# Patient Record
Sex: Female | Born: 1980 | Race: Black or African American | Hispanic: No | Marital: Married | Smoking: Never smoker
Health system: Southern US, Community
[De-identification: ages and names within clinical notes are randomized; demographics above are authoritative.]

## PROBLEM LIST (undated history)

## (undated) DIAGNOSIS — E669 Obesity, unspecified: Secondary | ICD-10-CM

## (undated) DIAGNOSIS — F32A Depression, unspecified: Secondary | ICD-10-CM

## (undated) DIAGNOSIS — F329 Major depressive disorder, single episode, unspecified: Secondary | ICD-10-CM

## (undated) HISTORY — DX: Depression, unspecified: F32.A

## (undated) HISTORY — DX: Obesity, unspecified: E66.9

## (undated) HISTORY — DX: Major depressive disorder, single episode, unspecified: F32.9

---

## 2002-09-20 ENCOUNTER — Encounter: Payer: Self-pay | Admitting: Obstetrics and Gynecology

## 2002-09-20 ENCOUNTER — Inpatient Hospital Stay (HOSPITAL_COMMUNITY): Admission: AD | Admit: 2002-09-20 | Discharge: 2002-09-22 | Payer: Self-pay | Admitting: Obstetrics and Gynecology

## 2002-11-26 ENCOUNTER — Emergency Department (HOSPITAL_COMMUNITY): Admission: EM | Admit: 2002-11-26 | Discharge: 2002-11-26 | Payer: Self-pay | Admitting: Emergency Medicine

## 2002-12-28 ENCOUNTER — Ambulatory Visit (HOSPITAL_COMMUNITY): Admission: RE | Admit: 2002-12-28 | Discharge: 2002-12-28 | Payer: Self-pay | Admitting: *Deleted

## 2003-03-15 ENCOUNTER — Ambulatory Visit (HOSPITAL_COMMUNITY): Admission: RE | Admit: 2003-03-15 | Discharge: 2003-03-15 | Payer: Self-pay | Admitting: Obstetrics and Gynecology

## 2003-04-21 ENCOUNTER — Inpatient Hospital Stay (HOSPITAL_COMMUNITY): Admission: AD | Admit: 2003-04-21 | Discharge: 2003-04-21 | Payer: Self-pay | Admitting: Obstetrics and Gynecology

## 2003-04-27 ENCOUNTER — Observation Stay (HOSPITAL_COMMUNITY): Admission: AD | Admit: 2003-04-27 | Discharge: 2003-04-28 | Payer: Self-pay | Admitting: Obstetrics and Gynecology

## 2003-05-02 ENCOUNTER — Encounter: Admission: RE | Admit: 2003-05-02 | Discharge: 2003-05-02 | Payer: Self-pay | Admitting: *Deleted

## 2003-05-06 ENCOUNTER — Inpatient Hospital Stay (HOSPITAL_COMMUNITY): Admission: AD | Admit: 2003-05-06 | Discharge: 2003-05-06 | Payer: Self-pay | Admitting: Obstetrics and Gynecology

## 2003-05-10 ENCOUNTER — Inpatient Hospital Stay (HOSPITAL_COMMUNITY): Admission: AD | Admit: 2003-05-10 | Discharge: 2003-05-12 | Payer: Self-pay | Admitting: *Deleted

## 2004-08-20 ENCOUNTER — Other Ambulatory Visit: Admission: RE | Admit: 2004-08-20 | Discharge: 2004-08-20 | Payer: Self-pay | Admitting: Obstetrics and Gynecology

## 2005-11-10 ENCOUNTER — Inpatient Hospital Stay (HOSPITAL_COMMUNITY): Admission: AD | Admit: 2005-11-10 | Discharge: 2005-11-11 | Payer: Self-pay | Admitting: Obstetrics & Gynecology

## 2005-12-08 ENCOUNTER — Other Ambulatory Visit: Admission: RE | Admit: 2005-12-08 | Discharge: 2005-12-08 | Payer: Self-pay | Admitting: Obstetrics and Gynecology

## 2006-01-04 ENCOUNTER — Other Ambulatory Visit: Admission: RE | Admit: 2006-01-04 | Discharge: 2006-01-04 | Payer: Self-pay | Admitting: Obstetrics and Gynecology

## 2006-01-31 ENCOUNTER — Ambulatory Visit (HOSPITAL_COMMUNITY): Admission: RE | Admit: 2006-01-31 | Discharge: 2006-01-31 | Payer: Self-pay | Admitting: Obstetrics and Gynecology

## 2006-05-17 ENCOUNTER — Ambulatory Visit (HOSPITAL_COMMUNITY): Admission: RE | Admit: 2006-05-17 | Discharge: 2006-05-17 | Payer: Self-pay | Admitting: Obstetrics and Gynecology

## 2006-05-27 ENCOUNTER — Inpatient Hospital Stay (HOSPITAL_COMMUNITY): Admission: AD | Admit: 2006-05-27 | Discharge: 2006-05-27 | Payer: Self-pay | Admitting: Obstetrics and Gynecology

## 2006-06-04 ENCOUNTER — Inpatient Hospital Stay (HOSPITAL_COMMUNITY): Admission: AD | Admit: 2006-06-04 | Discharge: 2006-06-04 | Payer: Self-pay | Admitting: Obstetrics and Gynecology

## 2006-06-09 ENCOUNTER — Emergency Department (HOSPITAL_COMMUNITY): Admission: EM | Admit: 2006-06-09 | Discharge: 2006-06-09 | Payer: Self-pay | Admitting: Family Medicine

## 2006-06-18 ENCOUNTER — Inpatient Hospital Stay (HOSPITAL_COMMUNITY): Admission: AD | Admit: 2006-06-18 | Discharge: 2006-06-18 | Payer: Self-pay | Admitting: Obstetrics and Gynecology

## 2006-06-20 ENCOUNTER — Inpatient Hospital Stay (HOSPITAL_COMMUNITY): Admission: RE | Admit: 2006-06-20 | Discharge: 2006-06-22 | Payer: Self-pay | Admitting: Obstetrics and Gynecology

## 2006-07-19 ENCOUNTER — Inpatient Hospital Stay (HOSPITAL_COMMUNITY): Admission: AD | Admit: 2006-07-19 | Discharge: 2006-07-19 | Payer: Self-pay | Admitting: Obstetrics and Gynecology

## 2006-12-27 ENCOUNTER — Emergency Department (HOSPITAL_COMMUNITY): Admission: EM | Admit: 2006-12-27 | Discharge: 2006-12-27 | Payer: Self-pay | Admitting: Emergency Medicine

## 2007-12-20 ENCOUNTER — Emergency Department (HOSPITAL_COMMUNITY): Admission: EM | Admit: 2007-12-20 | Discharge: 2007-12-20 | Payer: Self-pay | Admitting: Family Medicine

## 2007-12-26 IMAGING — CR DG CHEST 2V
2 series · 2 of 2 positions shown · non-contrast
Comparison: None.

CLINICAL DATA: Right-sided chest pain.

CHEST - 2 VIEW  12/27/2006:

[view not recorded (1 of 2)]
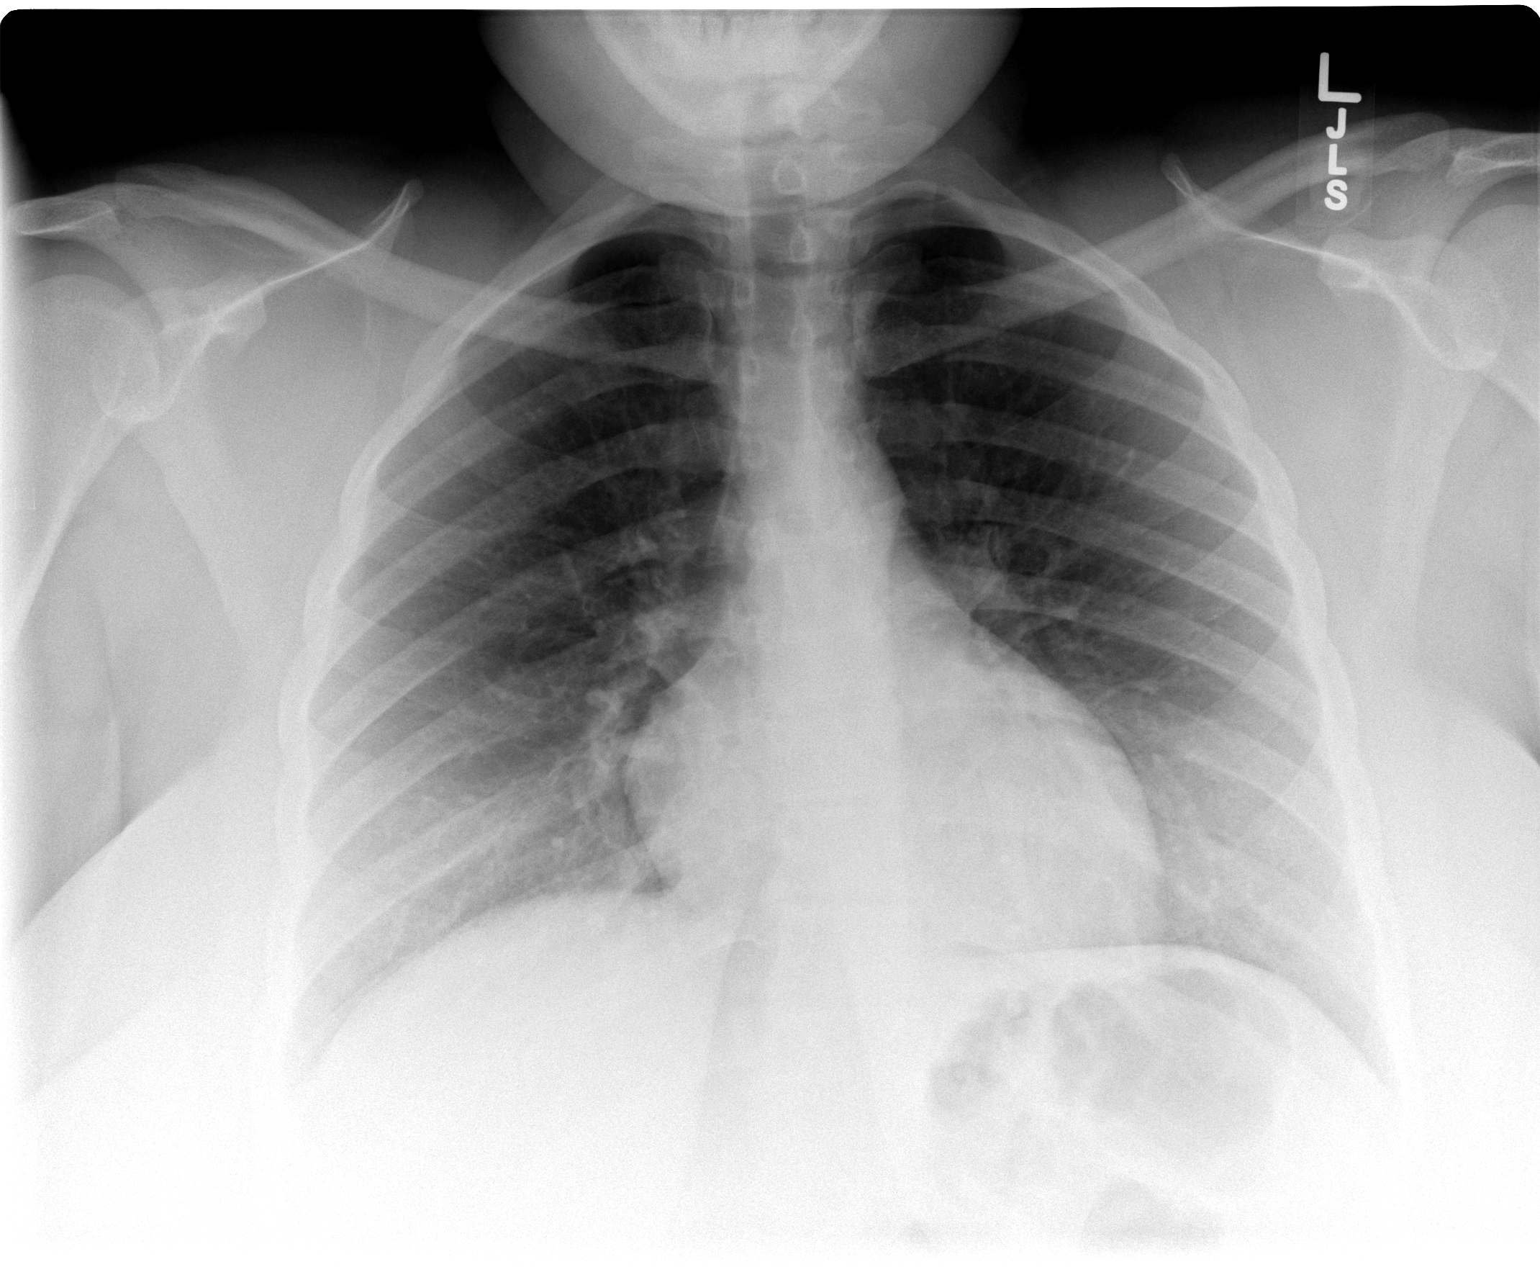

[view not recorded (2 of 2)]
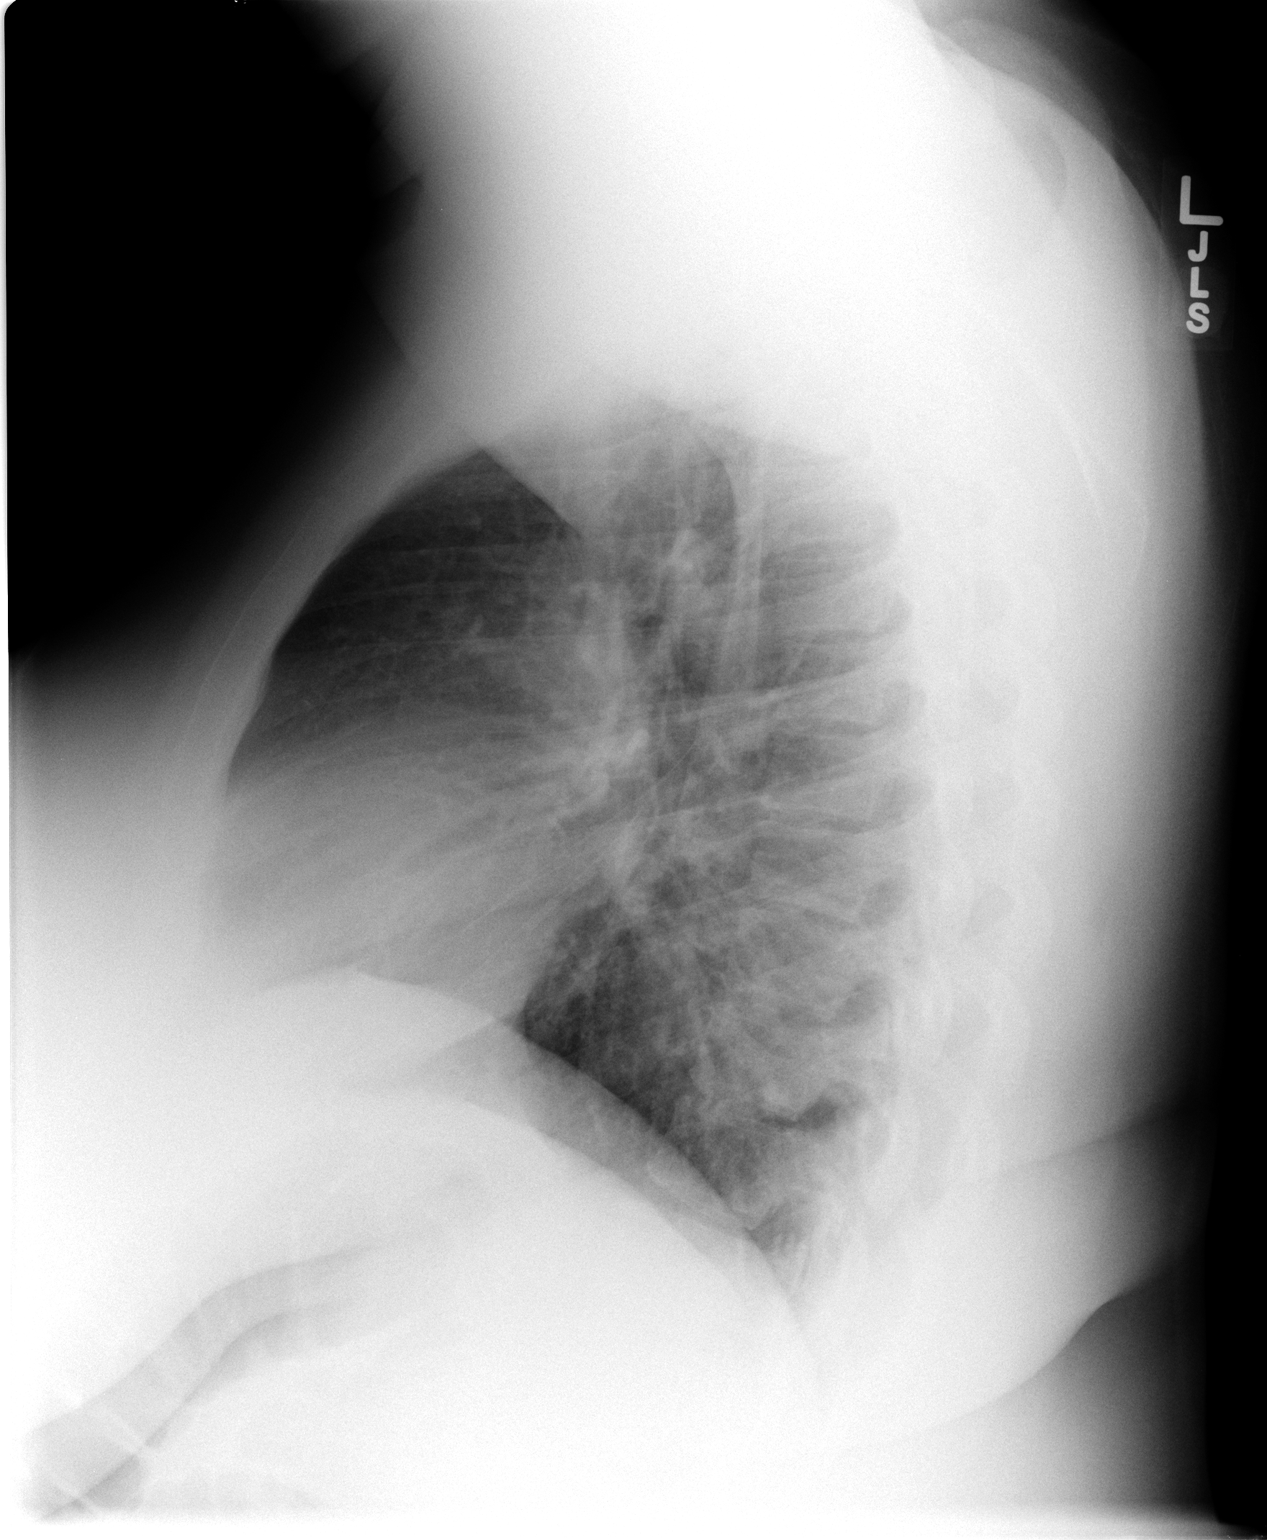

[2 of 2 positions shown; findings below may reference images not displayed]

FINDINGS: Cardiomediastinal silhouette unremarkable for age. Pulmonary
parenchyma clear. Visualized bony thorax intact.
IMPRESSION: Normal chest.

## 2010-06-20 ENCOUNTER — Inpatient Hospital Stay (INDEPENDENT_AMBULATORY_CARE_PROVIDER_SITE_OTHER)
Admission: RE | Admit: 2010-06-20 | Discharge: 2010-06-20 | Disposition: A | Discharge status: Home / Self Care | Payer: Self-pay | Source: Ambulatory Visit | Attending: Family Medicine | Admitting: Family Medicine

## 2010-06-20 DIAGNOSIS — H109 Unspecified conjunctivitis: Secondary | ICD-10-CM

## 2010-06-20 DIAGNOSIS — J309 Allergic rhinitis, unspecified: Secondary | ICD-10-CM

## 2010-09-18 NOTE — Discharge Summary (Signed)
[name], Sarah Roach                           ACCOUNT NO.:  [id number]   MEDICAL RECORD NO.:  [id number]                   PATIENT TYPE:   LOCATION:                                       FACILITY:   PHYSICIAN:  Phil D. Okey Dupre, M.D.                  DATE OF BIRTH:  Jul 29, 1980   DATE OF ADMISSION:  DATE OF DISCHARGE:                                 DISCHARGE SUMMARY   DISCHARGE DIAGNOSES:  1. Intrauterine pregnancy at 37 4/7 weeks' gestation.  2. Prodromal labor.   DISCHARGE MEDICATIONS:  1. Iron sulfate.  2. Prenatal vitamins.   HOSPITAL COURSE:  A 30 year old G2, P1-0-0-1, who presented at 33 3/7 weeks'  gestation on April 27, 2003, for contractions that started early in the  morning.  She denied any rupture of membrane or vaginal bleeding.  She had  good fetal movement and contractions were getting stronger.  She had a  history of precipitous delivery with her first child.  Initial set of vitals  was within normal limits with a blood pressure of 110/69.  Her exam showed a  cervix of 3 to 4 cm, 60% effacement, and -3 station with a posterior cervix,  baby vertex.  The patient was walked for two hours in MAU and progressed to  a cervix of 4 cm, 80%, and -3 station.  Fetal heart rate was reassuring with  135 to 140 baseline, accelerations, good variability, and mild variable  decelerations.  The patient was given Stadol and Phenergan for her early  labor and placed on the monitor.  She was admitted for observation.  On  hospital day number two, the patient was rechecked and cervix remained about  5 cm, 50% effaced, and very posterior and high.  Fetal heart tones remained  reassuring and contraction pattern has stopped.  The patient was still  without rupture membranes; therefore a BPP and AFI were checked.   RADIOLOGY RESULTS:  BPP showed a score of 8/8 with a fetal heart rate of  128, a posterior grade 2 placenta, cephalic presentation, amniotic fluid  index was 9.7 cm.   PERTINENT LABORATORY:  On admission, the patient was noted to be GBS  positive.   Due to the patient unable to change her cervix within the last eight hours,  no contraction pattern at 37 4/7 weeks' gestation, intact membranes, and  reassuring fetal heart tones, reassuring BPP and amniotic fluid index, we  will send the patient home with labor instructions.  The patient has a  followup appointment at [facility] on Thursday at 1230 and is to call for  an appointment with Diane Day for a follow up ultrasound this week.     Lorne Skeens, D.O.                         Phil D. Okey Dupre, M.D.    Erick Alley  D:  04/28/2003  T:  04/28/2003  Job:  [id number]

## 2010-09-18 NOTE — Discharge Summary (Signed)
   Sarah Roach, Sarah Roach                           ACCOUNT NO.:  [id number]   MEDICAL RECORD NO.:  [id number]                   PATIENT TYPE:  INP   LOCATION:  9303                                 [facility]ACILITY:  [facility]   PHYSICIAN:  Tamika J. Lazarus Salines, M.D.                DATE OF BIRTH:  1980-10-20   DATE OF ADMISSION:  09/19/2002  DATE OF DISCHARGE:  09/22/2002                                 DISCHARGE SUMMARY   DISCHARGE DIAGNOSES:  1. Intrauterine pregnancy at six weeks.  2. Fever.  3. UTI.  4. Viral syndrome.   DISCHARGE MEDICATIONS:  1. Prenatal vitamin p.o. daily.  2. Amoxicillin 500 mg p.o. t.i.d. x 7 days.   FOLLOW UP:  Patient instructed to call [facility] on Monday to schedule  followup appointment and initial visit for prenatal care.   HISTORY OF PRESENT ILLNESS:  In brief, the patient is a 30 year old African  American female gravida 2, para 1-0-0-1 who presented to [facility]  with fever to 103.2, chills, headache, nausea and vomited for several days.  She denied any abdominal pain, vaginal bleeding, vaginal discharge or  dysuria.  Did report malodorous urine.   ADMISSION LABORATORY DATA:  Labs on admission included WBC 13.6, hemoglobin  11.6, platelets 219.  Sodium 129, potassium 3.3, chloride 98, CO2 20,  glucose 101, BUN 10, creatinine 1.2.   Cath urinalysis revealed 15 ketones, 100 protein, positive nitrite and small  leukocyte esterase.   HOSPITAL COURSE:  The patient was admitted for presumed urinary tract  infection.  The hospital course was uncomplicated.  She was hydrated with IV  fluids.  She was started on treatment with IV Ampicillin.  Urine culture  grew 1,000 colonies of E. coli.  She gradually defervesced.  By day of  discharge her temperature was 98.9.  She was feeling much better and wanting  to go home.   DIAGNOSTIC STUDIES:  Of note she did have an OB ultrasound during this  hospitalization which confirmed her pregnancy at six weeks on Sep 20, 2002.   PLAN:  Continue amoxicillin for seven days to complete a ten day course of  antibiotics for UTI.   DISCHARGE INSTRUCTIONS:  Patient was discharged to home in stable condition.                                                Tamika J. Lazarus Salines, M.D.    [name]  D:  09/22/2002  T:  09/22/2002  Job:  [id number]

## 2010-09-18 NOTE — Discharge Summary (Signed)
Sarah Roach, Sarah Roach               ACCOUNT NO.:  [id number]   MEDICAL RECORD NO.:  [id number]          PATIENT TYPE:  INP   LOCATION:  9128                          [facility]ACILITY:  [facility]   PHYSICIAN:  James A. Ashley Royalty, M.D.DATE OF BIRTH:  1980/10/21   DATE OF ADMISSION:  06/20/2006  DATE OF DISCHARGE:  06/22/2006                               DISCHARGE SUMMARY   DISCHARGE DIAGNOSES:  1. Intrauterine pregnancy at 39+ weeks' gestation, delivered.  2. Obesity.  3. 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  4. Term-birth living child, vertex.   OPERATIONS AND SPECIAL PROCEDURES:  OB delivery.   CONSULTATIONS:  None.   DISCHARGE MEDICATIONS:  Motrin 600 mg.   HISTORY AND PHYSICAL:  A 30 year old gravida 3, para 2-0-1-2, admitted  for induction secondary to musculoskeletal discomfort. For the remainder  of the history and physical, please see chart.   HOSPITAL COURSE:  The patient was admitted to [facility]  [place].   Laboratory studies were drawn. Rupture of membranes was accomplished and  Pitocin given. The patient went on to deliver on June 20, 2006 a 7-  pound 4-ounce female, Apgars 9 at one minute, 9 at five minutes, sent to  newborn nursery. Delivery was accomplished by Dr. Sylvester Harder without  complication. The patient's postpartum course was similarly benign. She  was discharged on the second postpartum day, afebrile and in  satisfactory condition.   DISPOSITION:  The patient is to return to [facility] in 6 weeks for postpartum evaluation.      James A. Ashley Royalty, M.D.  Electronically Signed     JAM/MEDQ  D:  08/03/2006  T:  08/03/2006  Job:  [id number]

## 2010-11-18 ENCOUNTER — Other Ambulatory Visit (HOSPITAL_COMMUNITY)
Admission: RE | Admit: 2010-11-18 | Discharge: 2010-11-18 | Disposition: A | Discharge status: Home / Self Care | Payer: 59 | Source: Ambulatory Visit | Attending: Family Medicine | Admitting: Family Medicine

## 2010-11-18 ENCOUNTER — Other Ambulatory Visit: Payer: Self-pay | Admitting: Family Medicine

## 2010-11-18 DIAGNOSIS — Z01419 Encounter for gynecological examination (general) (routine) without abnormal findings: Secondary | ICD-10-CM | POA: Insufficient documentation

## 2010-11-18 DIAGNOSIS — Z113 Encounter for screening for infections with a predominantly sexual mode of transmission: Secondary | ICD-10-CM | POA: Insufficient documentation

## 2012-05-26 ENCOUNTER — Ambulatory Visit (HOSPITAL_COMMUNITY): Payer: 59 | Admitting: Psychiatry

## 2012-06-13 ENCOUNTER — Encounter (HOSPITAL_COMMUNITY): Payer: Self-pay | Admitting: Psychiatry

## 2012-06-13 ENCOUNTER — Ambulatory Visit (INDEPENDENT_AMBULATORY_CARE_PROVIDER_SITE_OTHER): Payer: 59 | Admitting: Psychiatry

## 2012-06-13 VITALS — BP 144/84 | HR 69 | Wt 317.6 lb

## 2012-06-13 DIAGNOSIS — F329 Major depressive disorder, single episode, unspecified: Secondary | ICD-10-CM

## 2012-06-13 MED ORDER — FLUOXETINE HCL 10 MG PO CAPS: ORAL_CAPSULE | ORAL | Status: DC

## 2012-06-13 NOTE — Progress Notes (Signed)
Patient ID: Sarah Roach, female   DOB: 31-May-1980, 32 y.o.   MRN: [id number]  Chief complaint I'm suffering from depression.  I need help.  History presenting illness Patient is 32 year old African American single unemployed female who is self-referred for seeking treatment for her depression.  Patient endorse for past 2 years she's been experiencing depressive symptoms.  She endorse irritability, sadness, discouragement, low self esteem, crying spells and decreased motivation in her life.  She also endorse decreased attention and concentration and eating more cope with her depression.  She has gained 40 pounds in past few months.  She admitted her mood swings are getting worse and recently she started to have passive suicidal thinking but no plan.  She sleeps on and off in endorse racing thoughts.  Her main stressors are finances, raising 3 kids and stressful job.  Patient is overweight and last year she was enrolled in weight loss clinic, she was seen in bariatric clinic and given phentermine and vitamin B12 shots.  She was able to loss weight however due to finances she was unable to keep up with her appointment in gain more weight.  Patient endorse paranoia in public places and does not feel comfortable around strangers.  She's not taking any psychotropic medication.  She's not engage in any counseling or therapy.  Past psychiatric history Patient has a previous history of psychiatric inpatient treatment or any suicidal attempt.  She denies any history of seeing psychiatrist or any therapy.  She endorse the symptoms for a long time but never treated or seen any help.  She denies any history of mania.  Psychosocial history Patient was born and raised in [place].  Her parents live in [place].  She has 30 children from 2 different relationship.  Her brother and sister who lives in town.  She has limited social support.  Education and work history Patient has some college education.  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  She admitted her job has been a stressful in recent months.  Alcohol and substance use history She admitted drinking alcohol on occasion.  She denies any binge drinking or any history of withdrawal tremors shakes.  Patient denies any history of using marijuana or any illegal substance use.  Medical history She has obesity.  She see physician at [facility] physician at this market.  Her annual physical and working blood test is scheduled in few weeks.  Review of Systems  Constitutional: Negative.   Musculoskeletal: Negative.   Neurological: Negative.   Psychiatric/Behavioral: Positive for depression and hallucinations. Negative for substance abuse. The patient is nervous/anxious and has insomnia.    Mental status examination Patient is casually dressed and fairly groomed.  She dressed appropriate for weather.  She maintained good eye contact.  Her speech is slow but clear and coherent with normal tone volume.  Her thought processes slow but logical linear and goal-directed.  Her thoughts are organized.  She described her mood is depressed and anxious and her affect is constricted.  There were no flight of ideas or any loose association.  Her fund of knowledge is adequate.  She endorse passive suicidal thoughts but no active suicidal thinking or plan.  She denies any auditory or visual hallucination at this time.  She endorse paranoia around strangers but there were no delusion obsession present at this time.  Her psychomotor activity is slightly decreased.  However there were no tremors or shakes present.  There were  no homicidal thoughts.  Her attention and concentration is fair.  Her memory is intact.  She's oriented x3.  Her insight judgment and impulse control is okay.  Assessment Axis I depressive disorder NOS, rule out Maj. depressive disorder with psychotic features Axis II deferred Axis III obesity Axis IV mild to moderate Axis V 55-65  Plan I  review her symptoms, psychosocial stressors discuss with her to start antidepressant.  Talked about starting Prozac 10 mg daily and gradually increase to 20 mg after one week.  I explained risks and benefits of medication in detail including short-term and long-term side effects.  I also referred to see therapist for coping and social skills.  We will get collateral information from her primary care physician including blood work and collateral records.  I recommend to call us if she is any question or concern if he feel worsening of the symptom.  We discussed safety plan that anytime having active suicidal thoughts or homicidal thoughts and he to call 911 or go to local emergency room.  Time spent 60 minutes.  I will see her again in 2-3 weeks.  Portion of this note is generated with software dictation and may contain typographical error.

## 2012-06-27 ENCOUNTER — Ambulatory Visit (HOSPITAL_COMMUNITY): Payer: Self-pay | Admitting: Psychiatry

## 2012-07-05 ENCOUNTER — Encounter (HOSPITAL_COMMUNITY): Payer: Self-pay

## 2012-07-05 ENCOUNTER — Ambulatory Visit (HOSPITAL_COMMUNITY): Payer: Self-pay | Admitting: Psychiatry

## 2012-11-22 ENCOUNTER — Other Ambulatory Visit: Payer: Self-pay | Admitting: Obstetrics and Gynecology

## 2012-11-22 DIAGNOSIS — E010 Iodine-deficiency related diffuse (endemic) goiter: Secondary | ICD-10-CM

## 2012-11-24 ENCOUNTER — Ambulatory Visit
Admission: RE | Admit: 2012-11-24 | Discharge: 2012-11-24 | Disposition: A | Discharge status: Home / Self Care | Payer: 59 | Source: Ambulatory Visit | Attending: Obstetrics and Gynecology | Admitting: Obstetrics and Gynecology

## 2012-11-24 DIAGNOSIS — E010 Iodine-deficiency related diffuse (endemic) goiter: Secondary | ICD-10-CM

## 2012-12-13 ENCOUNTER — Other Ambulatory Visit: Payer: Self-pay | Admitting: Otolaryngology

## 2013-11-23 IMAGING — US US SOFT TISSUE HEAD/NECK
1 series · 14 of 25 positions shown · non-contrast
Comparison: None.

CLINICAL DATA: Thyromegaly

THYROID ULTRASOUND
TECHNIQUE: Ultrasound examination of the thyroid gland and adjacent
soft tissues was performed.

[Series 1: us soft tissue head/neck · 0.08mm/px · 14 of 52 slices shown]
[im 1/52]
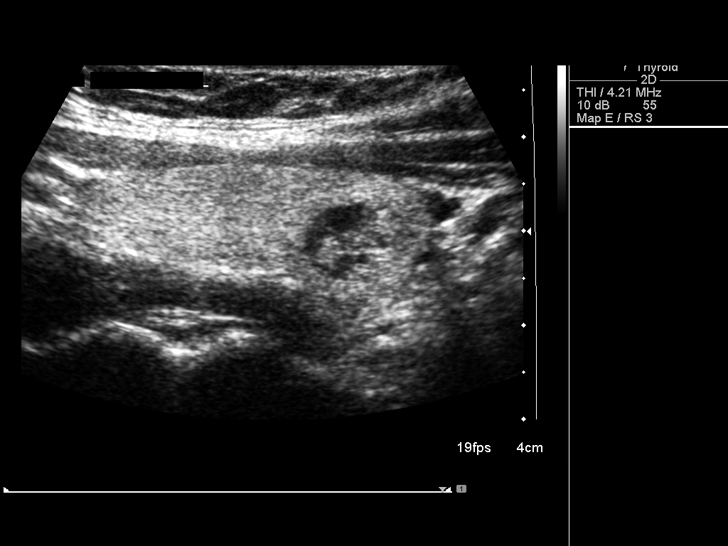
[im 5/52]
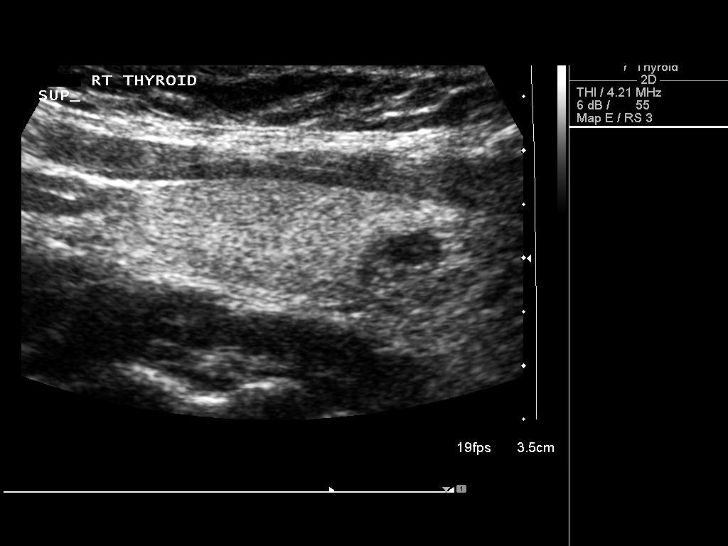
[im 9/52]
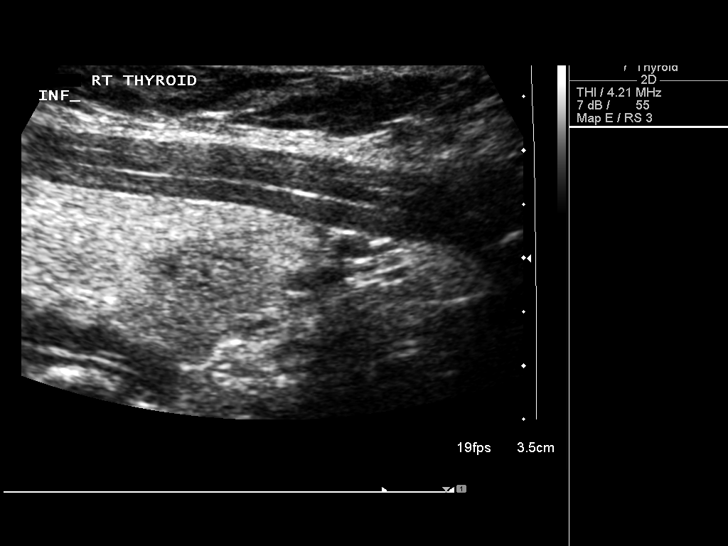
[im 13/52]
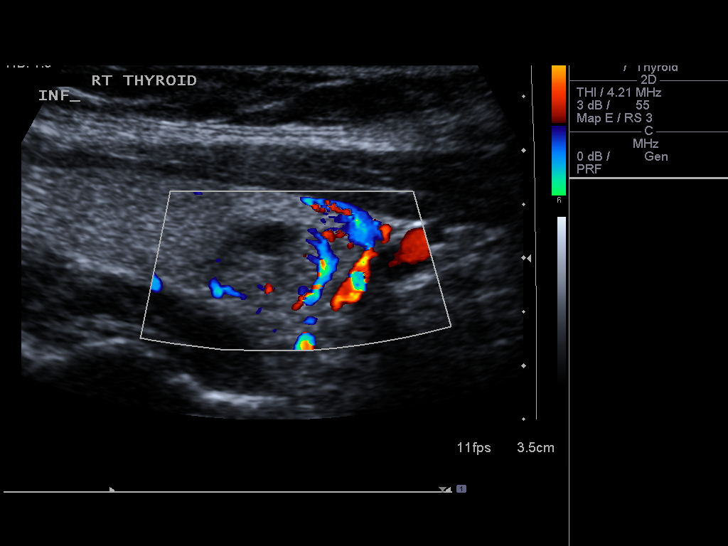
[im 18/52]
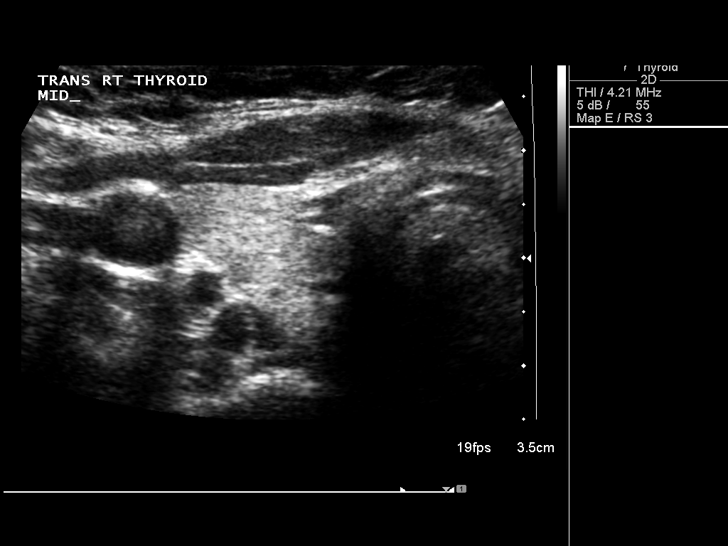
[im 20/52]
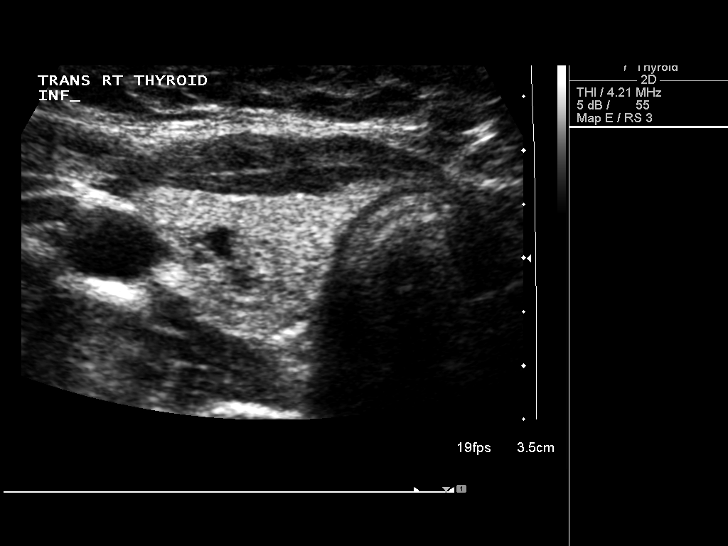
[im 24/52]
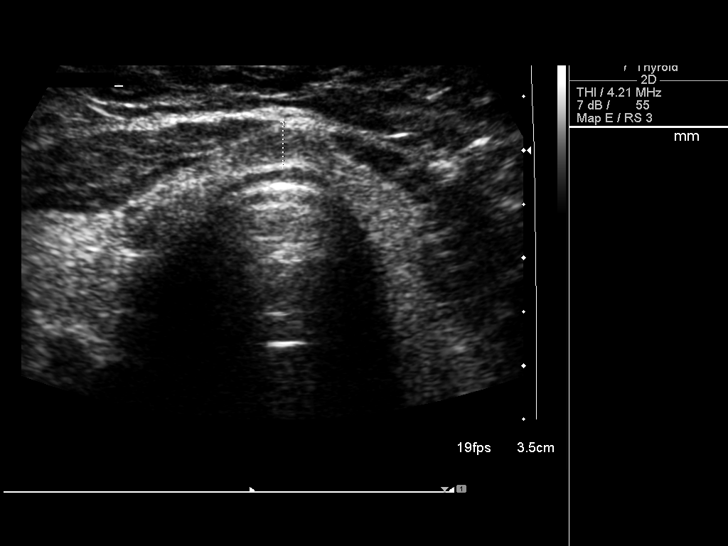
[im 28/52]
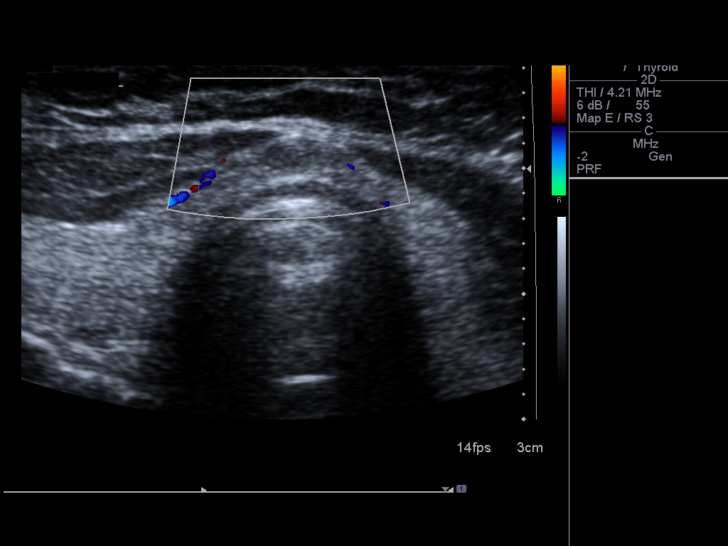
[im 32/52]
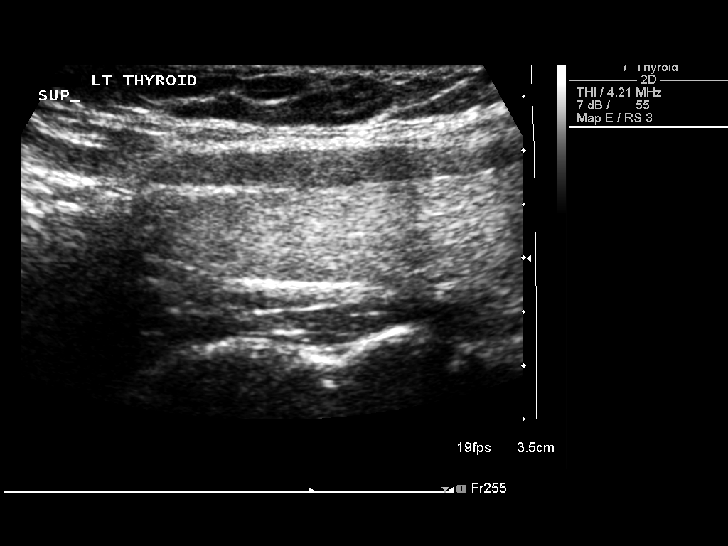
[im 35/52]
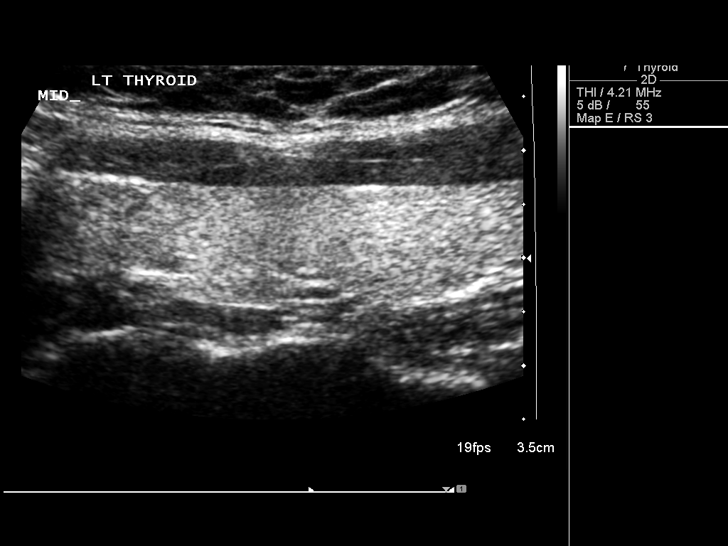
[im 39/52]
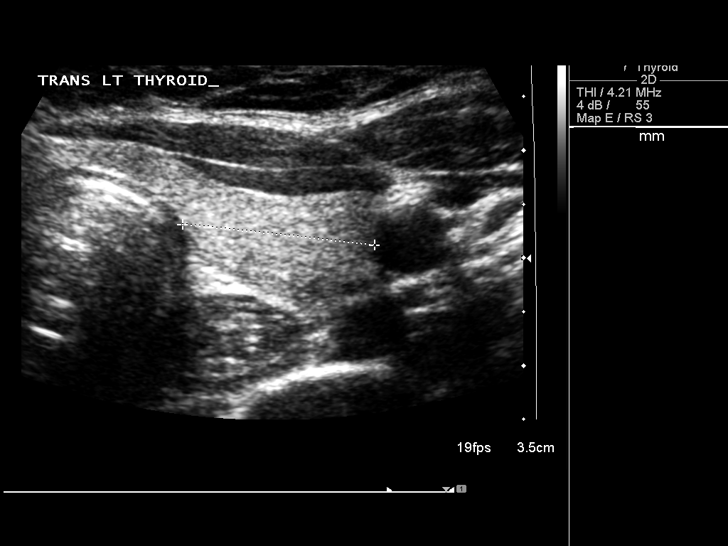
[im 43/52]
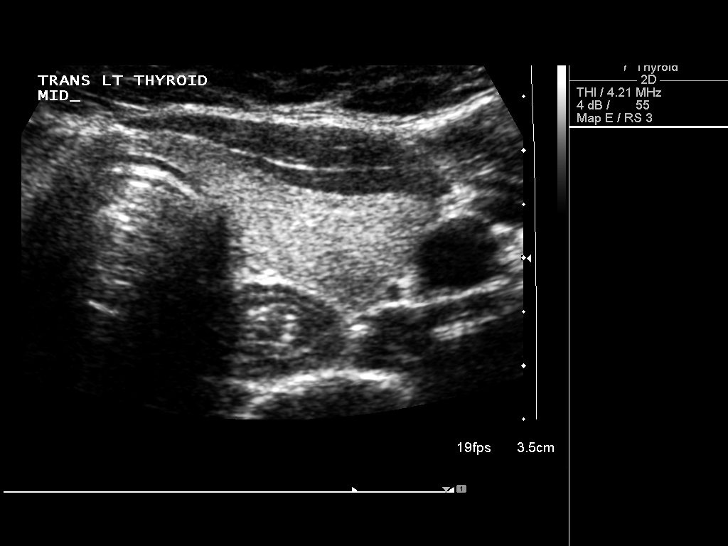
[im 47/52]
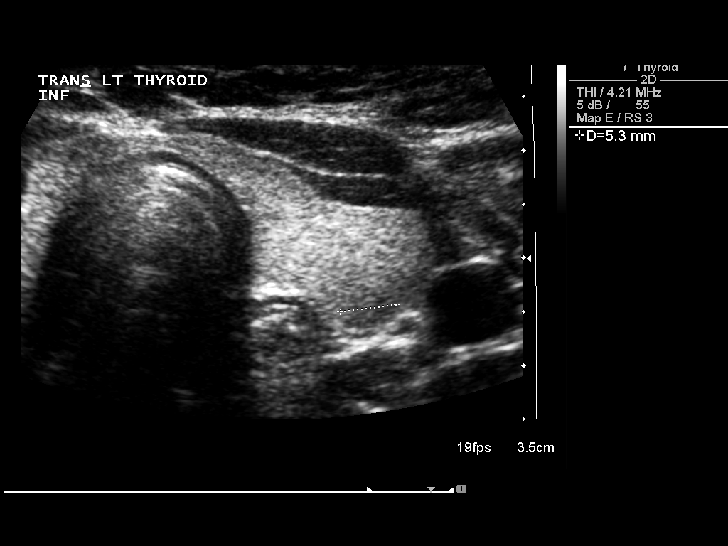
[im 52/52]
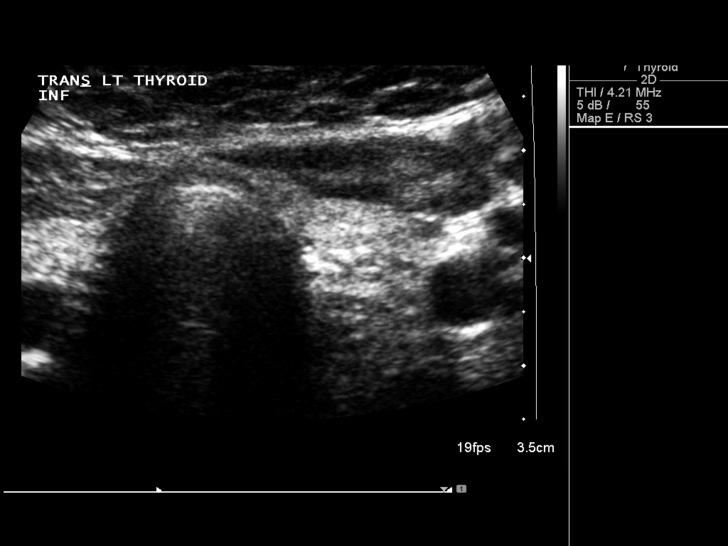

[14 of 25 positions shown; findings below may reference images not displayed]

FINDINGS: Right thyroid lobe:  3.9 x 1.7 x 1.3 cm
Left thyroid lobe:  4.7 x 1.8 x 1.2 cm
Isthmus:  0.4 cm

Focal nodules:

Right inferior thyroid lobe, solid/cystic, 1.3 x 1.0 x 0.8 cm.

Isthmus:  0.5 x 0.4 cm, solid

Left mid thyroid lobe, 1.0 x 0.5 x 0.5 cm, solid

Lymphadenopathy:  None visualized.
IMPRESSION: Thyroid nodules as above. Findings do not meet current SRU
consensus criteria for biopsy.  Follow-up by clinical exam is
recommended.  If patient has known risk factors for thyroid
carcinoma, consider follow-up ultrasound in 12 months.  If patient
is clinically hyperthyroid, consider nuclear medicine thyroid
uptake and scan.

Reference:  Management of Thyroid Nodules Detected at US:  Society
of Radiologists in Ultrasound Consensus Conference Statement.

## 2014-11-21 ENCOUNTER — Emergency Department (HOSPITAL_COMMUNITY)
Admission: EM | Admit: 2014-11-21 | Discharge: 2014-11-21 | Disposition: A | Discharge status: Home / Self Care | Payer: BLUE CROSS/BLUE SHIELD | Attending: Emergency Medicine | Admitting: Emergency Medicine

## 2014-11-21 ENCOUNTER — Encounter (HOSPITAL_COMMUNITY): Payer: Self-pay | Admitting: Emergency Medicine

## 2014-11-21 ENCOUNTER — Emergency Department (HOSPITAL_COMMUNITY): Payer: BLUE CROSS/BLUE SHIELD

## 2014-11-21 DIAGNOSIS — R0789 Other chest pain: Secondary | ICD-10-CM

## 2014-11-21 DIAGNOSIS — F329 Major depressive disorder, single episode, unspecified: Secondary | ICD-10-CM | POA: Diagnosis not present

## 2014-11-21 DIAGNOSIS — E669 Obesity, unspecified: Secondary | ICD-10-CM | POA: Diagnosis not present

## 2014-11-21 DIAGNOSIS — Z79899 Other long term (current) drug therapy: Secondary | ICD-10-CM | POA: Insufficient documentation

## 2014-11-21 DIAGNOSIS — R079 Chest pain, unspecified: Secondary | ICD-10-CM | POA: Diagnosis present

## 2014-11-21 DIAGNOSIS — Z3202 Encounter for pregnancy test, result negative: Secondary | ICD-10-CM | POA: Insufficient documentation

## 2014-11-21 DIAGNOSIS — F419 Anxiety disorder, unspecified: Secondary | ICD-10-CM

## 2014-11-21 LAB — CBC
HCT: 39 % (ref 36.0–46.0)
Hemoglobin: 13 g/dL (ref 12.0–15.0)
MCH: 29.7 pg (ref 26.0–34.0)
MCHC: 33.3 g/dL (ref 30.0–36.0)
MCV: 89 fL (ref 78.0–100.0)
Platelets: 303 K/uL (ref 150–400)
RBC: 4.38 MIL/uL (ref 3.87–5.11)
RDW: 13 % (ref 11.5–15.5)
WBC: 7.8 K/uL (ref 4.0–10.5)

## 2014-11-21 LAB — HEPATIC FUNCTION PANEL
ALBUMIN: 3.6 g/dL (ref 3.5–5.0)
ALK PHOS: 67 U/L (ref 38–126)
ALT: 15 U/L (ref 14–54)
AST: 22 U/L (ref 15–41)
Bilirubin, Direct: <0.1 mg/dL — ABNORMAL LOW (ref 0.1–0.5)
Total Bilirubin: 0.3 mg/dL (ref 0.3–1.2)
Total Protein: 8.2 g/dL — ABNORMAL HIGH (ref 6.5–8.1)

## 2014-11-21 LAB — LIPASE, BLOOD: Lipase: 18 U/L — ABNORMAL LOW (ref 22–51)

## 2014-11-21 LAB — BASIC METABOLIC PANEL
Anion gap: 7 (ref 5–15)
BUN: 14 mg/dL (ref 6–20)
CO2: 26 mmol/L (ref 22–32)
Calcium: 9.2 mg/dL (ref 8.9–10.3)
Chloride: 105 mmol/L (ref 101–111)
Creatinine, Ser: 1 mg/dL (ref 0.44–1.00)
GFR calc Af Amer: >60 mL/min (ref >60)
GFR calc non Af Amer: >60 mL/min (ref >60)
Glucose, Bld: 94 mg/dL (ref 65–99)
Potassium: 3.5 mmol/L (ref 3.5–5.1)
Sodium: 138 mmol/L (ref 135–145)

## 2014-11-21 LAB — POC URINE PREG, ED: Preg Test, Ur: NEGATIVE

## 2014-11-21 LAB — I-STAT TROPONIN, ED: Troponin i, poc: 0 ng/mL (ref 0.00–0.08)

## 2014-11-21 MED ORDER — GI COCKTAIL ~~LOC~~
30.0000 mL | Freq: Once | ORAL | Status: AC
  Administered 2014-11-21: 30 mL via ORAL
  Filled 2014-11-21: qty 30

## 2014-11-21 MED ORDER — DIAZEPAM 2 MG PO TABS
2.0000 mg | ORAL_TABLET | Freq: Once | ORAL | Status: AC
  Administered 2014-11-21: 2 mg via ORAL
  Filled 2014-11-21: qty 1

## 2014-11-21 MED ORDER — HYDROXYZINE HCL 25 MG PO TABS: 25.0000 mg | ORAL_TABLET | Freq: Four times a day (QID) | ORAL | Status: DC

## 2014-11-21 MED ORDER — DIAZEPAM 2 MG PO TABS: 2.0000 mg | ORAL_TABLET | Freq: Once | ORAL | Status: DC

## 2014-11-21 MED ORDER — OMEPRAZOLE 20 MG PO CPDR: 20.0000 mg | DELAYED_RELEASE_CAPSULE | Freq: Every day | ORAL | Status: DC

## 2014-11-21 NOTE — ED Notes (Signed)
Pt c/o intermittent chest pain that has been going on over the past 3 weeks.  Pt states that she has been really stressed lately and feels could be related to that.  Pt states that she has recently started having panic attacks.  Pt denies SI or HI.

## 2014-11-21 NOTE — Discharge Instructions (Signed)
Chest Pain (Nonspecific) °It is often hard to give a specific diagnosis for the cause of chest pain. There is always a chance that your pain could be related to something serious, such as a heart attack or a blood clot in the lungs. You need to follow up with your health care provider for further evaluation. °CAUSES  °· Heartburn. °· Pneumonia or bronchitis. °· Anxiety or stress. °· Inflammation around your heart (pericarditis) or lung (pleuritis or pleurisy). °· A blood clot in the lung. °· A collapsed lung (pneumothorax). It can develop suddenly on its own (spontaneous pneumothorax) or from trauma to the chest. °· Shingles infection (herpes zoster virus). °The chest wall is composed of bones, muscles, and cartilage. Any of these can be the source of the pain. °· The bones can be bruised by injury. °· The muscles or cartilage can be strained by coughing or overwork. °· The cartilage can be affected by inflammation and become sore (costochondritis). °DIAGNOSIS  °Lab tests or other studies may be needed to find the cause of your pain. Your health care provider may have you take a test called an ambulatory electrocardiogram (ECG). An ECG records your heartbeat patterns over a 24-hour period. You may also have other tests, such as: °· Transthoracic echocardiogram (TTE). During echocardiography, sound waves are used to evaluate how blood flows through your heart. °· Transesophageal echocardiogram (TEE). °· Cardiac monitoring. This allows your health care provider to monitor your heart rate and rhythm in real time. °· Holter monitor. This is a portable device that records your heartbeat and can help diagnose heart arrhythmias. It allows your health care provider to track your heart activity for several days, if needed. °· Stress tests by exercise or by giving medicine that makes the heart beat faster. °TREATMENT  °· Treatment depends on what may be causing your chest pain. Treatment may include: °· Acid blockers for  heartburn. °· Anti-inflammatory medicine. °· Pain medicine for inflammatory conditions. °· Antibiotics if an infection is present. °· You may be advised to change lifestyle habits. This includes stopping smoking and avoiding alcohol, caffeine, and chocolate. °· You may be advised to keep your head raised (elevated) when sleeping. This reduces the chance of acid going backward from your stomach into your esophagus. °Most of the time, nonspecific chest pain will improve within 2-3 days with rest and mild pain medicine.  °HOME CARE INSTRUCTIONS  °· If antibiotics were prescribed, take them as directed. Finish them even if you start to feel better. °· For the next few days, avoid physical activities that bring on chest pain. Continue physical activities as directed. °· Do not use any tobacco products, including cigarettes, chewing tobacco, or electronic cigarettes. °· Avoid drinking alcohol. °· Only take medicine as directed by your health care provider. °· Follow your health care provider's suggestions for further testing if your chest pain does not go away. °· Keep any follow-up appointments you made. If you do not go to an appointment, you could develop lasting (chronic) problems with pain. If there is any problem keeping an appointment, call to reschedule. °SEEK MEDICAL CARE IF:  °· Your chest pain does not go away, even after treatment. °· You have a rash with blisters on your chest. °· You have a fever. °SEEK IMMEDIATE MEDICAL CARE IF:  °· You have increased chest pain or pain that spreads to your arm, neck, jaw, back, or abdomen. °· You have shortness of breath. °· You have an increasing cough, or you cough   up blood. °· You have severe back or abdominal pain. °· You feel nauseous or vomit. °· You have severe weakness. °· You faint. °· You have chills. °This is an emergency. Do not wait to see if the pain will go away. Get medical help at once. Call your local emergency services (911 in [place] Do not drive  yourself to the hospital. °MAKE SURE YOU:  °· Understand these instructions. °· Will watch your condition. °· Will get help right away if you are not doing well or get worse. °Document Released: 01/27/2005 Document Revised: 04/24/2013 Document Reviewed: 11/23/2007 °ExitCare® Patient Information ©2015 [employer]. This information is not intended to replace advice given to you by your health care provider. Make sure you discuss any questions you have with your health care provider. ° °Panic Attacks °Panic attacks are sudden, short-lived surges of severe anxiety, fear, or discomfort. They may occur for no reason when you are relaxed, when you are anxious, or when you are sleeping. Panic attacks may occur for a number of reasons:  °· Healthy people occasionally have panic attacks in extreme, life-threatening situations, such as war or natural disasters. Normal anxiety is a protective mechanism of the body that helps us react to danger (fight or flight response). °· Panic attacks are often seen with anxiety disorders, such as panic disorder, social anxiety disorder, generalized anxiety disorder, and phobias. Anxiety disorders cause excessive or uncontrollable anxiety. They may interfere with your relationships or other life activities. °· Panic attacks are sometimes seen with other mental illnesses, such as depression and posttraumatic stress disorder. °· Certain medical conditions, prescription medicines, and drugs of abuse can cause panic attacks. °SYMPTOMS  °Panic attacks start suddenly, peak within 20 minutes, and are accompanied by four or more of the following symptoms: °· Pounding heart or fast heart rate (palpitations). °· Sweating. °· Trembling or shaking. °· Shortness of breath or feeling smothered. °· Feeling choked. °· Chest pain or discomfort. °· Nausea or strange feeling in your stomach. °· Dizziness, light-headedness, or feeling like you will faint. °· Chills or hot flushes. °· Numbness or tingling in  your lips or hands and feet. °· Feeling that things are not real or feeling that you are not yourself. °· Fear of losing control or going crazy. °· Fear of dying. °Some of these symptoms can mimic serious medical conditions. For example, you may think you are having a heart attack. Although panic attacks can be very scary, they are not life threatening. °DIAGNOSIS  °Panic attacks are diagnosed through an assessment by your health care provider. Your health care provider will ask questions about your symptoms, such as where and when they occurred. Your health care provider will also ask about your medical history and use of alcohol and drugs, including prescription medicines. Your health care provider may order blood tests or other studies to rule out a serious medical condition. Your health care provider may refer you to a mental health professional for further evaluation. °TREATMENT  °· Most healthy people who have one or two panic attacks in an extreme, life-threatening situation will not require treatment. °· The treatment for panic attacks associated with anxiety disorders or other mental illness typically involves counseling with a mental health professional, medicine, or a combination of both. Your health care provider will help determine what treatment is best for you. °· Panic attacks due to physical illness usually go away with treatment of the illness. If prescription medicine is causing panic attacks, talk with your health care   provider about stopping the medicine, decreasing the dose, or substituting another medicine. °· Panic attacks due to alcohol or drug abuse go away with abstinence. Some adults need professional help in order to stop drinking or using drugs. °HOME CARE INSTRUCTIONS  °· Take all medicines as directed by your health care provider.   °· Schedule and attend follow-up visits as directed by your health care provider. It is important to keep all your appointments. °SEEK MEDICAL CARE  IF: °· You are not able to take your medicines as prescribed. °· Your symptoms do not improve or get worse. °SEEK IMMEDIATE MEDICAL CARE IF:  °· You experience panic attack symptoms that are different than your usual symptoms. °· You have serious thoughts about hurting yourself or others. °· You are taking medicine for panic attacks and have a serious side effect. °MAKE SURE YOU: °· Understand these instructions. °· Will watch your condition. °· Will get help right away if you are not doing well or get worse. °Document Released: 04/19/2005 Document Revised: 04/24/2013 Document Reviewed: 12/01/2012 °ExitCare® Patient Information ©2015 [employer]. This information is not intended to replace advice given to you by your health care provider. Make sure you discuss any questions you have with your health care provider. ° °

## 2014-11-21 NOTE — ED Provider Notes (Signed)
CSN: [id number]     Arrival date & time 11/21/14  1749 History   First MD Initiated Contact with Patient 11/21/14 1816     Chief Complaint  Patient presents with  . Chest Pain  . Anxiety     (Consider location/radiation/quality/duration/timing/severity/associated sxs/prior Treatment) HPI   Patient is a 34 year old female with history of obesity and depression, currently on Prozac, who reports the emergency department today for intermittent substernal and epigastric burning which began yesterday, is without radiation, and is rated 6 out of 10.  She has not treated it with anything but states it is coming and going. She denies any acid reflux symptoms or history, denies dyspepsia. She has not had any associated nausea, vomiting, diarrhea, fever, sweats or chills. She is also complaining of "panic attacks" and recent increased stress due to her daughter running away 2 weeks ago. She denies ever being treated for anxiety or panic attacks. She states that she began having the burning in her chest before having a panic attack yesterday.  She says when she has one her "heart beats out of her chest", she feels like she's choking, hyperventilates and cries. She reports trouble getting to sleep. She says her daughters back home now that there is increased stress and anxiety in the home. Her depression and medications are maintained by her primary care provider, who she has not seen for this issue. She denies any palpitation, lower extremity edema, shortness of breath, syncope or presyncope, diaphoresis.  She denies any history of hypertension.  She has an implant for birth control, states there is no chance she could be pregnant. She has had no pleuritic chest pain, her chest discomfort is not reproduced with inspiration or palpation, not improved or exacerbation by any position.  She has no history of blood clots, has not had any recent travel.   Past Medical History  Diagnosis Date  . Obesity   .  Depression    History reviewed. No pertinent past surgical history. Family History  Problem Relation Age of Onset  . Depression Mother    History  Substance Use Topics  . Smoking status: Never Smoker   . Smokeless tobacco: Not on file  . Alcohol Use: No   OB History    No data available     Review of Systems 10 Systems reviewed and are negative for acute change except as noted in the HPI.      Allergies  Review of patient's allergies indicates no known allergies.  Home Medications   Prior to Admission medications   Medication Sig Start Date End Date Taking? Authorizing Provider  FLUoxetine (PROZAC) 20 MG capsule Take 20 mg by mouth daily.   Yes Historical Provider, MD  FLUoxetine (PROZAC) 10 MG capsule Take 1 capsule for 1 week and than 2 capsule dai;y Patient not taking: Reported on 11/21/2014 06/13/12   Cleotis Nipper, MD   BP 140/86 mmHg  Pulse 70  Temp(Src) 98.8 F (37.1 C) (Oral)  Resp 14  SpO2 100% Physical Exam  Constitutional: She is oriented to person, place, and time. She appears well-developed and well-nourished. No distress.  Obese female, appears stated age, laying in ER gurney, in NAD, talking on her cell phone  HENT:  Head: Normocephalic and atraumatic.  Right Ear: External ear normal.  Left Ear: External ear normal.  Nose: Nose normal.  Mouth/Throat: Oropharynx is clear and moist. No oropharyngeal exudate.  Eyes: Conjunctivae and EOM are normal. Pupils are equal, round, and reactive to  light. Right eye exhibits no discharge. Left eye exhibits no discharge. No scleral icterus.  Neck: Normal range of motion. No JVD present. No tracheal deviation present. No thyromegaly present.  Cardiovascular: Normal rate, regular rhythm, normal heart sounds and intact distal pulses.  Exam reveals no gallop and no friction rub.   No murmur heard. Symmetrical pulses, 2+, radial and dorsal pedis, no lower extremity edema  Pulmonary/Chest: Effort normal and breath sounds  normal. No respiratory distress. She has no wheezes. She has no rales. She exhibits no tenderness.  Abdominal: Soft. Bowel sounds are normal. She exhibits no distension and no mass. There is no tenderness. There is no rebound and no guarding.  Abdomen soft obese bowel sounds 4 no tenderness no guarding no rebound  Musculoskeletal: Normal range of motion. She exhibits no edema or tenderness.  Lymphadenopathy:    She has no cervical adenopathy.  Neurological: She is alert and oriented to person, place, and time. She has normal reflexes. No cranial nerve deficit. She exhibits normal muscle tone. Coordination normal.  Skin: Skin is warm and dry. No rash noted. She is not diaphoretic. No erythema. No pallor.  Psychiatric: She has a normal mood and affect. Her behavior is normal. Judgment and thought content normal.  Nursing note and vitals reviewed.   ED Course  Procedures (including critical care time) Labs Review Labs Reviewed  BASIC METABOLIC PANEL  CBC  LIPASE, BLOOD  HEPATIC FUNCTION PANEL  I-STAT TROPOININ, ED  POC URINE PREG, ED    Imaging Review Dg Chest 2 View  11/21/2014   CLINICAL DATA:  Intermittent chest pain  EXAM: CHEST  2 VIEW  COMPARISON:  12/27/2006  FINDINGS: Cardiomediastinal silhouette is stable. No acute infiltrate or pleural effusion. No pulmonary edema. Bony thorax is unremarkable.  IMPRESSION: No active cardiopulmonary disease.   Electronically Signed   By: Natasha Mead M.D.   On: 11/21/2014 18:19     EKG Interpretation None      MDM   Final diagnoses:  None    Substernal chest pain without radiation, and complaint of panic attack CBC, BMP, i-STAT troponin, urine pregnancy, chest x-ray and EKG to rule out cardiac pathology With aspect of epigastric pain we'll add on liver function tests and lipase  Patient is afebrile, not tachycardic, appears very comfortable in the room, has answered her phone multiple times during history and exam.  Chest pain is not  reproducible, and I highly doubt any cardiac pathology, PERC negative  I suspect esophagitis as the etiology of her pain, likely exacerbated by increased anxiety, GI cocktail given, I have offered by mouth Valium for increased anxiety, pending negative pregnancy test first.  Pt had minimal relief with GI cocktail.  Patient will be given by mouth Valium for anxiety, that appears to be when she is very concerned about. She is updated with negative cardiac workup. Given omeprazole and Vistaril to DC home with, encouraged follow-up with her primary care provider for further workup and treatment of new panic attacks with anxiety      Danelle Berry, PA-C 11/22/14 0313  Samuel Jester, DO 11/24/14 [phone]

## 2015-02-18 ENCOUNTER — Ambulatory Visit (INDEPENDENT_AMBULATORY_CARE_PROVIDER_SITE_OTHER): Payer: BLUE CROSS/BLUE SHIELD | Admitting: Licensed Clinical Social Worker

## 2015-02-18 DIAGNOSIS — F331 Major depressive disorder, recurrent, moderate: Secondary | ICD-10-CM | POA: Diagnosis not present

## 2015-02-18 DIAGNOSIS — F411 Generalized anxiety disorder: Secondary | ICD-10-CM | POA: Diagnosis not present

## 2015-02-25 ENCOUNTER — Ambulatory Visit: Payer: BLUE CROSS/BLUE SHIELD | Admitting: Licensed Clinical Social Worker

## 2015-03-05 ENCOUNTER — Ambulatory Visit: Payer: BLUE CROSS/BLUE SHIELD | Admitting: Licensed Clinical Social Worker

## 2015-05-07 ENCOUNTER — Other Ambulatory Visit: Payer: Self-pay | Admitting: Obstetrics and Gynecology

## 2015-11-20 IMAGING — CR DG CHEST 2V
2 series · 2 of 2 positions shown · non-contrast
Comparison: 12/27/2006

CLINICAL DATA: Intermittent chest pain

EXAM:
CHEST  2 VIEW

[w chest pa]
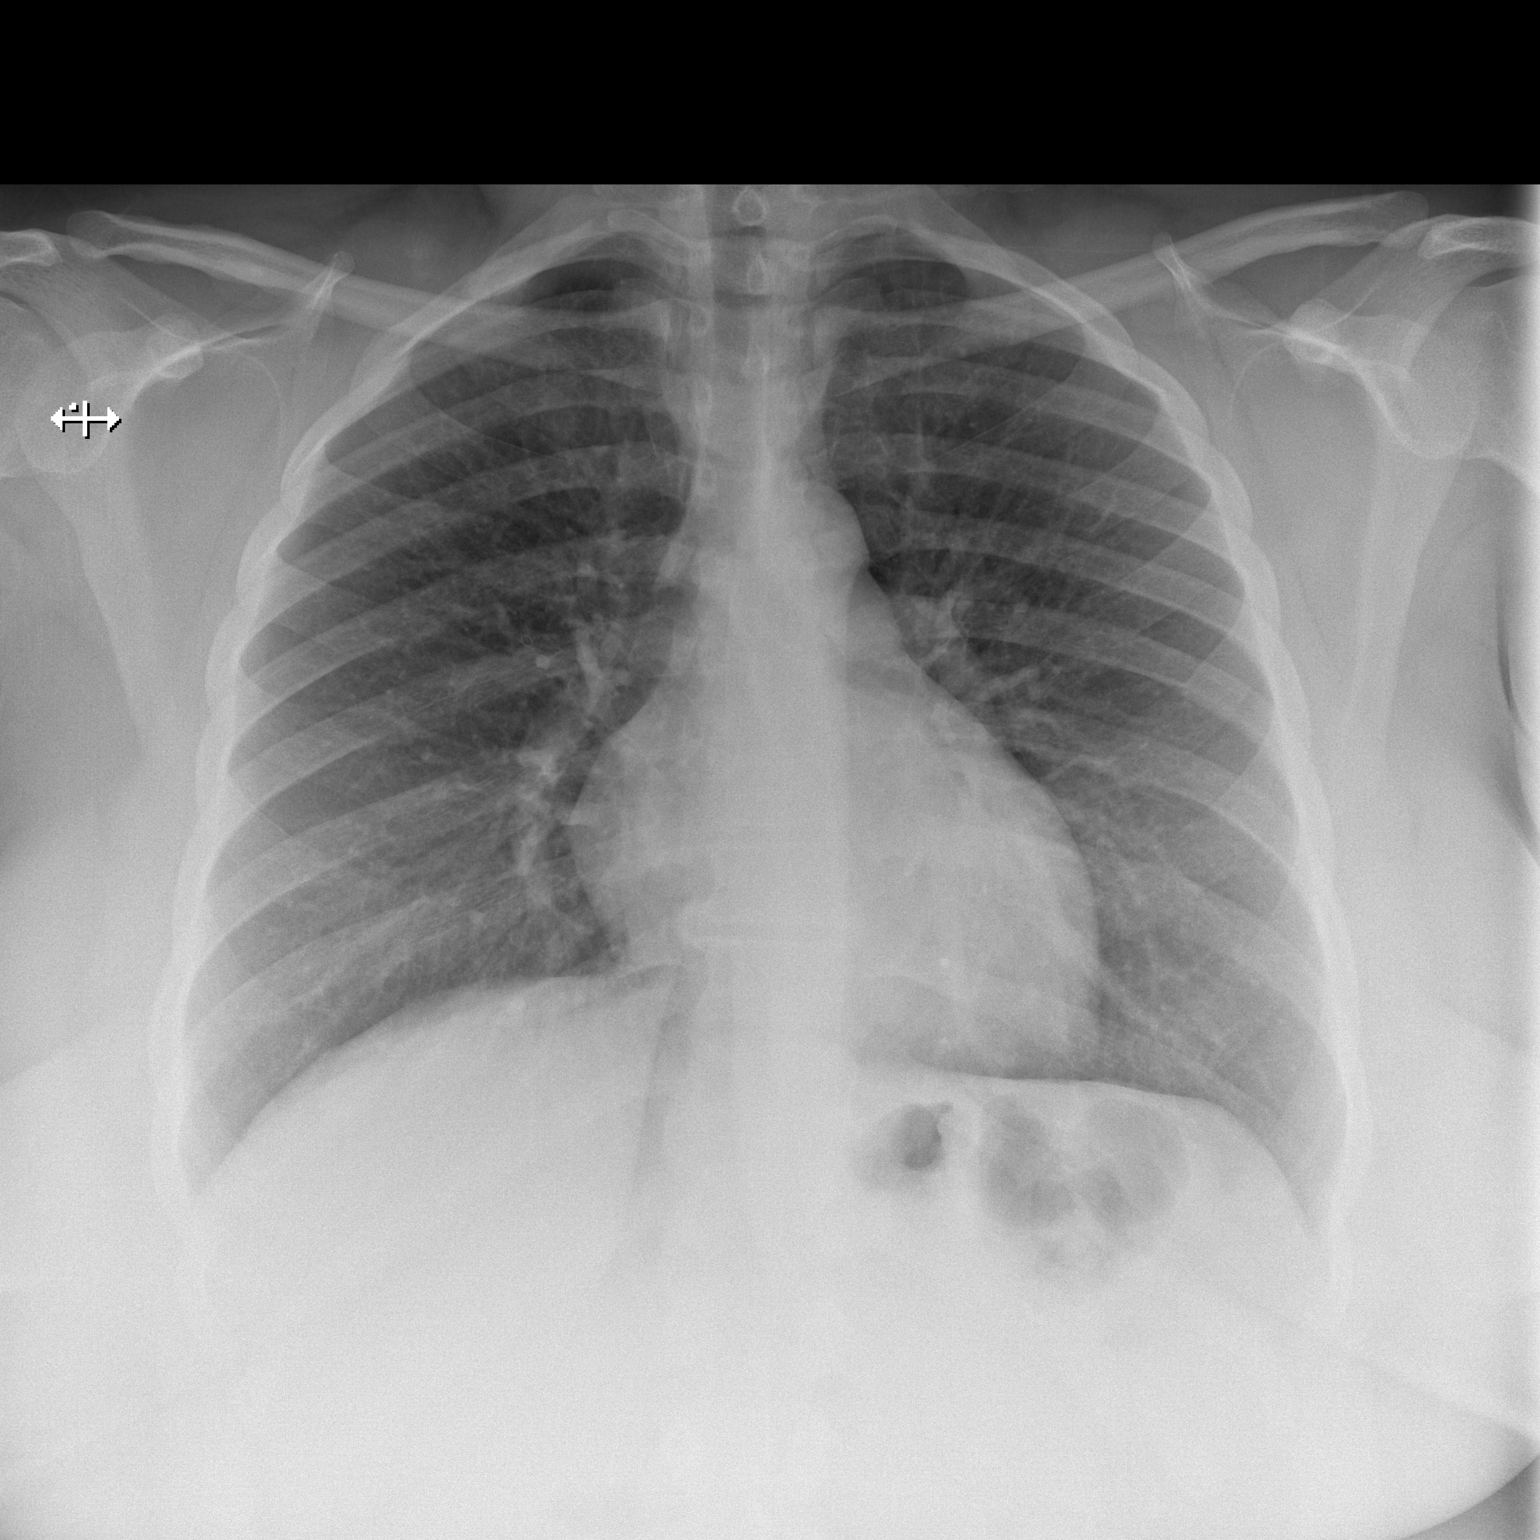

[w chest lat]
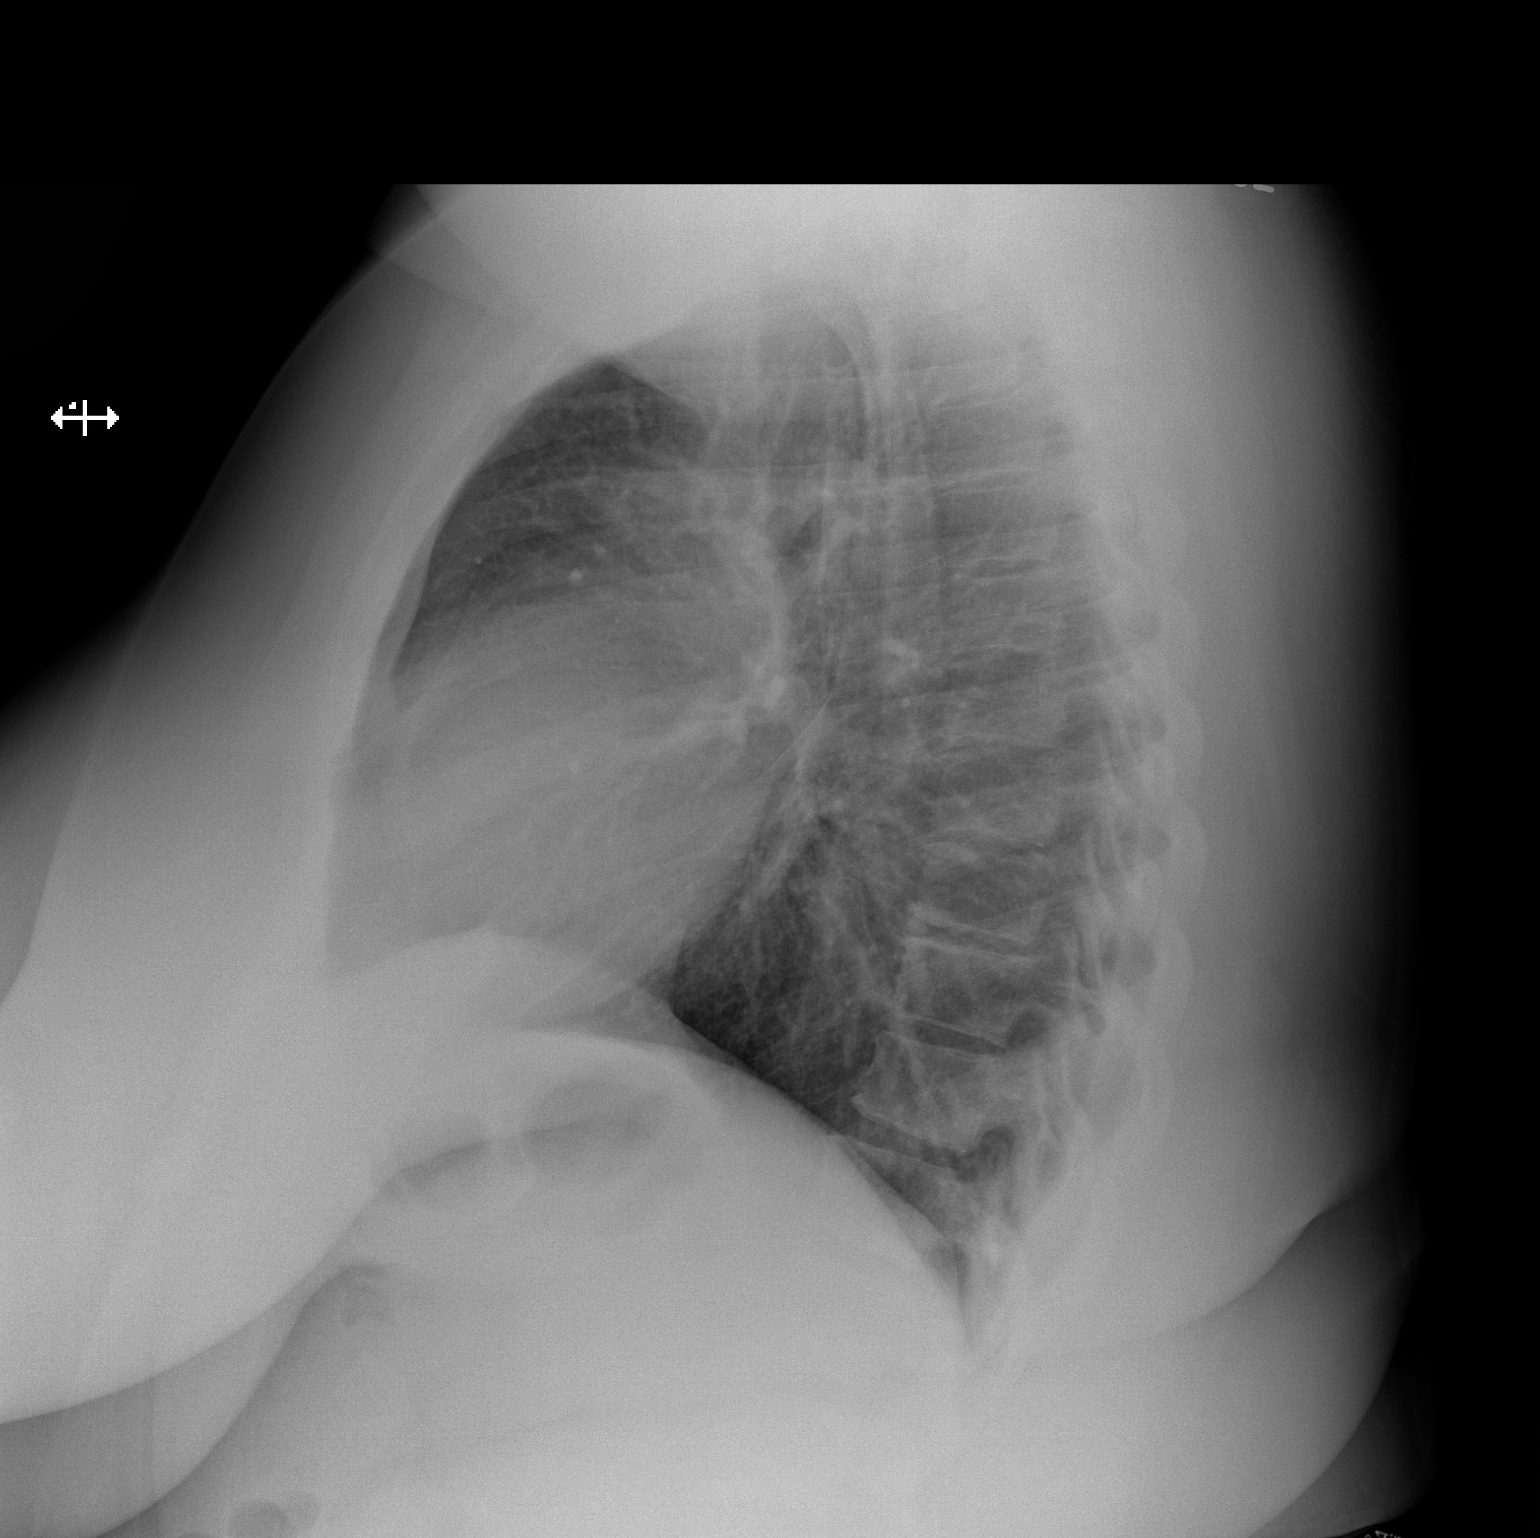

[2 of 2 positions shown; findings below may reference images not displayed]

FINDINGS: Cardiomediastinal silhouette is stable. No acute infiltrate or
pleural effusion. No pulmonary edema. Bony thorax is unremarkable.
IMPRESSION: No active cardiopulmonary disease.

## 2017-01-08 ENCOUNTER — Inpatient Hospital Stay (HOSPITAL_COMMUNITY)
Admission: AD | Admit: 2017-01-08 | Discharge: 2017-01-08 | Disposition: A | Discharge status: Home / Self Care | Payer: BLUE CROSS/BLUE SHIELD | Source: Ambulatory Visit | Attending: Obstetrics & Gynecology | Admitting: Obstetrics & Gynecology

## 2017-01-08 ENCOUNTER — Encounter (HOSPITAL_COMMUNITY): Payer: Self-pay

## 2017-01-08 DIAGNOSIS — M545 Low back pain, unspecified: Secondary | ICD-10-CM

## 2017-01-08 DIAGNOSIS — R1030 Lower abdominal pain, unspecified: Secondary | ICD-10-CM

## 2017-01-08 DIAGNOSIS — G8929 Other chronic pain: Secondary | ICD-10-CM

## 2017-01-08 LAB — URINALYSIS, ROUTINE W REFLEX MICROSCOPIC
Bacteria, UA: NONE SEEN
Glucose, UA: NEGATIVE mg/dL
Ketones, ur: 5 mg/dL — AB
NITRITE: NEGATIVE
PROTEIN: 30 mg/dL — AB
Specific Gravity, Urine: 1.04 — ABNORMAL HIGH (ref 1.005–1.030)
pH: 5 (ref 5.0–8.0)

## 2017-01-08 LAB — WET PREP, GENITAL
CLUE CELLS WET PREP: NONE SEEN
Sperm: NONE SEEN
TRICH WET PREP: NONE SEEN
YEAST WET PREP: NONE SEEN

## 2017-01-08 LAB — POCT PREGNANCY, URINE: Preg Test, Ur: NEGATIVE

## 2017-01-08 MED ORDER — IBUPROFEN 800 MG PO TABS
800.0000 mg | ORAL_TABLET | Freq: Once | ORAL | Status: AC
  Administered 2017-01-08: 800 mg via ORAL
  Filled 2017-01-08: qty 1

## 2017-01-08 MED ORDER — IBUPROFEN 800 MG PO TABS: 800.0000 mg | ORAL_TABLET | Freq: Three times a day (TID) | ORAL | 0 refills | Status: DC

## 2017-01-08 NOTE — Discharge Instructions (Signed)

## 2017-01-08 NOTE — Progress Notes (Addendum)
Non pregnant pt presents to triage for abdominal and back pain for a week. Denies bleeding or foul smelling odor. UPT negative   1322:Provider at bs assesing. Cultures for GC and wetprep done.   1410: Ketones noted in urine. PO hydration initiated. Provider made aware.  1430: discharge instructions given with pt understanding. Pt left unit via ambulatory with SO

## 2017-01-08 NOTE — MAU Provider Note (Signed)
History   36 yo female in with c/o low abd and back pain x 1 month. Pain is dull in nature and radiates from back thru to abd. Pt is concerned she might be pregnant even though she is on DMPA for contraception.  CSN: [id number]  Arrival date & time 01/08/17  1257   None     Chief Complaint  Patient presents with  . Back Pain  . Abdominal Pain    HPI  Past Medical History:  Diagnosis Date  . Depression   . Obesity     No past surgical history on file.  Family History  Problem Relation Age of Onset  . Depression Mother     Social History  Substance Use Topics  . Smoking status: Never Smoker  . Smokeless tobacco: Not on file  . Alcohol use No    OB History    Gravida Para Term Preterm AB Living   3 3 3     3    SAB TAB Ectopic Multiple Live Births                  Review of Systems  Constitutional: Negative.   HENT: Negative.   Eyes: Negative.   Respiratory: Negative.   Cardiovascular: Negative.   Endocrine: Negative.   Genitourinary: Positive for vaginal bleeding.  Musculoskeletal: Positive for back pain.  Skin: Negative.   Allergic/Immunologic: Negative.   Neurological: Negative.   Hematological: Negative.   Psychiatric/Behavioral: Negative.     Allergies  Patient has no known allergies.  Home Medications    There were no vitals taken for this visit.  Physical Exam  Constitutional: She is oriented to person, place, and time. She appears well-developed and well-nourished.  HENT:  Head: Normocephalic.  Eyes: Pupils are equal, round, and reactive to light.  Neck: Normal range of motion.  Cardiovascular: Normal rate, regular rhythm, normal heart sounds and intact distal pulses.   Pulmonary/Chest: Effort normal and breath sounds normal.  Abdominal: Soft. Bowel sounds are normal.  Genitourinary: Vagina normal and uterus normal.  Musculoskeletal: Normal range of motion.  Neurological: She is alert and oriented to person, place, and time. She has  normal reflexes.  Skin: Skin is warm and dry.  Psychiatric: She has a normal mood and affect. Her behavior is normal. Judgment and thought content normal.    MAU Course  Procedures (including critical care time)  Labs Reviewed  URINALYSIS, ROUTINE W REFLEX MICROSCOPIC - Abnormal; Notable for the following:       Result Value   Color, Urine AMBER (*)    APPearance HAZY (*)    Specific Gravity, Urine 1.040 (*)    Hgb urine dipstick SMALL (*)    Bilirubin Urine SMALL (*)    Ketones, ur 5 (*)    Protein, ur 30 (*)    Leukocytes, UA SMALL (*)    Squamous Epithelial / LPF 0-5 (*)    All other components within normal limits  WET PREP, GENITAL  POCT PREGNANCY, URINE  GC/CHLAMYDIA PROBE AMP ([place]) NOT AT [facility]   No results found.   1. Lower abdominal pain   2. Chronic bilateral low back pain without sciatica       MDM  Wet prep neg,u/s WNL,  Cultures obtained, VSS. Pt has just started her menses on arrival to unit. UPT neg. Will d/c home to f/u with promary care provider

## 2017-01-10 LAB — GC/CHLAMYDIA PROBE AMP (~~LOC~~) NOT AT ARMC
Chlamydia: NEGATIVE
NEISSERIA GONORRHEA: NEGATIVE

## 2018-11-20 ENCOUNTER — Other Ambulatory Visit: Payer: Self-pay

## 2018-11-20 ENCOUNTER — Ambulatory Visit
Admission: EM | Admit: 2018-11-20 | Discharge: 2018-11-20 | Disposition: A | Discharge status: Home / Self Care | Payer: BC Managed Care – PPO | Attending: Physician Assistant | Admitting: Physician Assistant

## 2018-11-20 DIAGNOSIS — M25562 Pain in left knee: Secondary | ICD-10-CM

## 2018-11-20 MED ORDER — DICLOFENAC SODIUM 1 % TD GEL: 2.0000 g | Freq: Four times a day (QID) | TRANSDERMAL | 0 refills | Status: DC

## 2018-11-20 MED ORDER — TRAMADOL HCL 50 MG PO TABS: 50.0000 mg | ORAL_TABLET | Freq: Two times a day (BID) | ORAL | 0 refills | Status: DC | PRN

## 2018-11-20 NOTE — ED Provider Notes (Signed)
[facility]    CSN: [id number] Arrival date & time: 11/20/18  1618      History   Chief Complaint Chief Complaint  Patient presents with  . Knee Pain    HPI Sarah Roach is a 38 y.o. female.   38 year old female comes in for 2 day history of left knee pain after injury. [place] and her daughter fell onto her, causing her to fall. She landed on her knees, and daughter landed to the back of the knee. She has since been able to ambulate on own, though with pain. Denies swelling, contusion. Denies pain at rest. Denies radiation of pain. Denies trouble moving her knee. She has been taking tylenol with mild relief.      Past Medical History:  Diagnosis Date  . Depression   . Obesity     There are no active problems to display for this patient.   History reviewed. No pertinent surgical history.  OB History    Gravida  3   Para  3   Term  3   Preterm      AB      Living  3     SAB      TAB      Ectopic      Multiple      Live Births               Home Medications    Prior to Admission medications   Medication Sig Start Date End Date Taking? Authorizing Provider  Cyanocobalamin (VITAMIN B-12 PO) Take 1 tablet by mouth daily.    [provider]  diclofenac sodium (VOLTAREN) 1 % GEL Apply 2 g topically 4 (four) times daily. 11/20/18   Cathie HoopsYu, Glennette Galster V, PA-C  ibuprofen (ADVIL,MOTRIN) 800 MG tablet Take 1 tablet (800 mg total) by mouth 3 (three) times daily. 01/08/17   Montez MoritaLawson, Marie D, CNM  Melatonin 10 MG TABS Take 20 mg by mouth at bedtime.    [provider]  traMADol (ULTRAM) 50 MG tablet Take 1 tablet (50 mg total) by mouth every 12 (twelve) hours as needed. 11/20/18   Belinda FisherYu, Mahari Vankirk V, PA-C    Family History Family History  Problem Relation Age of Onset  . Depression Mother     Social History Social History   Tobacco Use  . Smoking status: Never Smoker  . Smokeless tobacco:  Never Used  Substance Use Topics  . Alcohol use: No  . Drug use: No     Allergies   Patient has no known allergies.   Review of Systems Review of Systems  Reason unable to perform ROS: See HPI as above.     Physical Exam Triage Vital Signs ED Triage Vitals  Enc Vitals Group     BP 11/20/18 1629 119/69     Pulse Rate 11/20/18 1629 (!) 59     Resp 11/20/18 1629 18     Temp 11/20/18 1629 98.1 F (36.7 C)     Temp Source 11/20/18 1629 Oral     SpO2 11/20/18 1629 99 %     Weight --      Height --      Head Circumference --      Peak Flow --      Pain Score 11/20/18 1630 3     Pain Loc --      Pain Edu? --  Excl. in GC? --    No data found.  Updated Vital Signs BP 119/69 (BP Location: Left Arm)   Pulse (!) 59   Temp 98.1 F (36.7 C) (Oral)   Resp 18   SpO2 99%   Physical Exam Constitutional:      General: She is not in acute distress.    Appearance: She is well-developed. She is not diaphoretic.  HENT:     Head: Normocephalic and atraumatic.  Eyes:     Conjunctiva/sclera: Conjunctivae normal.     Pupils: Pupils are equal, round, and reactive to light.  Musculoskeletal:     Comments: No swelling, erythema, warmth, contusion seen. Tenderness to palpation along medial joint line. Full ROM of knee. Strength normal and equal bilaterally. Sensation intact and equal bilaterally. No laxity felt.   Neurological:     Mental Status: She is alert and oriented to person, place, and time.      UC Treatments / Results  Labs (all labs ordered are listed, but only abnormal results are displayed) Labs Reviewed - No data to display  EKG   Radiology No results found.  Procedures Procedures (including critical care time)  Medications Ordered in UC Medications - No data to display  Initial Impression / Assessment and Plan / UC Course  I have reviewed the triage vital signs and the nursing notes.  Pertinent labs & imaging results that were available during  my care of the patient were reviewed by me and considered in my medical decision making (see chart for details).    No indication of xray at this time. Discussed possible ligament/meniscus injury. Patient with recent history of sleeve gastrectomy, and unable to take NSAIDs. Will have patient continue tylenol, use voltaren gel as directed. Tramadol for break through pain. Ice compress, rest, knee brace during activity. To follow up with PCP in 2 weeks for reevaluation if symptoms not improving.  Final Clinical Impressions(s) / UC Diagnoses   Final diagnoses:  Acute pain of left knee    ED Prescriptions    Medication Sig Dispense Auth. Provider   traMADol (ULTRAM) 50 MG tablet Take 1 tablet (50 mg total) by mouth every 12 (twelve) hours as needed. 10 tablet Tasia Catchings, Nakeshia Waldeck V, PA-C   diclofenac sodium (VOLTAREN) 1 % GEL Apply 2 g topically 4 (four) times daily. 150 g Ok Edwards, PA-C     Controlled Substance Prescriptions [place] Controlled Substance Registry consulted? Yes, I have consulted the [place] Controlled Substances Registry for this patient, and feel the risk/benefit ratio today is favorable for proceeding with this prescription for a controlled substance.   Ok Edwards, PA-C 11/20/18 [phone]

## 2018-11-20 NOTE — ED Triage Notes (Signed)
[place] her and threw her child on her. C/o lt knee/inner thigh pain.

## 2018-11-20 NOTE — Discharge Instructions (Addendum)
As discussed, no indications for x-ray at this time.  Worrisome for strain to the ligament, or meniscus injury.  Given you cannot take ibuprofen type medication at this time.  Continue Tylenol 1000 mg 3 times a day. Supplement with tramadol as needed for pain.  Apply Voltaren gel along affected area.  Ice compress, rest, elevation, knee brace during activity.  If symptoms do not improving in 1 to 2 weeks, follow-up with PCP for further evaluation and management needed.

## 2020-01-11 ENCOUNTER — Ambulatory Visit
Admission: EM | Admit: 2020-01-11 | Discharge: 2020-01-11 | Disposition: A | Discharge status: Home / Self Care | Payer: BC Managed Care – PPO | Attending: Emergency Medicine | Admitting: Emergency Medicine

## 2020-01-11 ENCOUNTER — Other Ambulatory Visit: Payer: Self-pay

## 2020-01-11 DIAGNOSIS — J069 Acute upper respiratory infection, unspecified: Secondary | ICD-10-CM | POA: Diagnosis not present

## 2020-01-11 DIAGNOSIS — Z1152 Encounter for screening for COVID-19: Secondary | ICD-10-CM

## 2020-01-11 MED ORDER — CETIRIZINE HCL 10 MG PO TABS: 10.0000 mg | ORAL_TABLET | Freq: Every day | ORAL | 0 refills | Status: DC

## 2020-01-11 MED ORDER — BENZONATATE 100 MG PO CAPS: 100.0000 mg | ORAL_CAPSULE | Freq: Three times a day (TID) | ORAL | 0 refills | Status: DC

## 2020-01-11 MED ORDER — FLUTICASONE PROPIONATE 50 MCG/ACT NA SUSP: 1.0000 | Freq: Every day | NASAL | 0 refills | Status: DC

## 2020-01-11 NOTE — ED Triage Notes (Signed)
Pt states she has experienced cough and headache x 3-4 days. Pt states she lost her sense of smell approximately 2 days ago but can still taste. Pt is aox4 and ambulatory.

## 2020-01-11 NOTE — Discharge Instructions (Signed)

## 2020-01-11 NOTE — ED Provider Notes (Signed)
[facility]    CSN: [id number] Arrival date & time: 01/11/20  1100      History   Chief Complaint Chief Complaint  Patient presents with  . Cough    x 3-4 days  . Headache    x 3-4 days    HPI Harold Mattes McCoy-Harris is a 39 y.o. female  Presenting for Covid testing.  Endorsing dry cough, sinus congestion and pressure with associated headache for the last 3-4 days.  States onset was sudden and without fever, chills, throbs, myalgias, shortness of breath, chest pain or palpitations.  Reports good appetite and activity level.  No known sick contacts, did get Covid vaccine.  Past Medical History:  Diagnosis Date  . Depression   . Obesity     There are no problems to display for this patient.   History reviewed. No pertinent surgical history.  OB History    Gravida  3   Para  3   Term  3   Preterm      AB      Living  3     SAB      TAB      Ectopic      Multiple      Live Births               Home Medications    Prior to Admission medications   Medication Sig Start Date End Date Taking? Authorizing Provider  Cyanocobalamin (VITAMIN B-12 PO) Take 1 tablet by mouth daily.   Yes [provider]  ibuprofen (ADVIL,MOTRIN) 800 MG tablet Take 1 tablet (800 mg total) by mouth 3 (three) times daily. 01/08/17  Yes Montez Morita, CNM  Melatonin 10 MG TABS Take 20 mg by mouth at bedtime.   Yes [provider]  benzonatate (TESSALON) 100 MG capsule Take 1 capsule (100 mg total) by mouth every 8 (eight) hours. 01/11/20   Hall-Potvin, [place], PA-C  cetirizine (ZYRTEC ALLERGY) 10 MG tablet Take 1 tablet (10 mg total) by mouth daily. 01/11/20   Hall-Potvin, [place], PA-C  fluticasone (FLONASE) 50 MCG/ACT nasal spray Place 1 spray into both nostrils daily. 01/11/20   Hall-Potvin, [place], PA-C    Family History Family History  Problem Relation Age of Onset  . Depression Mother   . Healthy Father     Social History Social  History   Tobacco Use  . Smoking status: Never Smoker  . Smokeless tobacco: Never Used  Vaping Use  . Vaping Use: Never used  Substance Use Topics  . Alcohol use: No  . Drug use: No     Allergies   Patient has no known allergies.   Review of Systems As per HPI   Physical Exam Triage Vital Signs ED Triage Vitals  Enc Vitals Group     BP 01/11/20 1215 123/83     Pulse --      Resp 01/11/20 1215 18     Temp 01/11/20 1215 98.7 F (37.1 C)     Temp Source 01/11/20 1215 Oral     SpO2 01/11/20 1215 98 %     Weight --      Height --      Head Circumference --      Peak Flow --      Pain Score 01/11/20 1217 6     Pain Loc --      Pain Edu? --      Excl. in GC? --    No  data found.  Updated Vital Signs BP 123/83 (BP Location: Left Arm)   Temp 98.7 F (37.1 C) (Oral)   Resp 18   LMP 12/26/2019 (Approximate)   SpO2 98%   Visual Acuity Right Eye Distance:   Left Eye Distance:   Bilateral Distance:    Right Eye Near:   Left Eye Near:    Bilateral Near:     Physical Exam Constitutional:      General: She is not in acute distress.    Appearance: She is obese. She is not ill-appearing or diaphoretic.  HENT:     Head: Normocephalic and atraumatic.     Mouth/Throat:     Mouth: Mucous membranes are moist.     Pharynx: Oropharynx is clear. No oropharyngeal exudate or posterior oropharyngeal erythema.  Eyes:     General: No scleral icterus.    Conjunctiva/sclera: Conjunctivae normal.     Pupils: Pupils are equal, round, and reactive to light.  Neck:     Comments: Trachea midline, negative JVD Cardiovascular:     Rate and Rhythm: Normal rate and regular rhythm.     Heart sounds: No murmur heard.  No gallop.   Pulmonary:     Effort: Pulmonary effort is normal. No respiratory distress.     Breath sounds: No wheezing, rhonchi or rales.  Musculoskeletal:     Cervical back: Neck supple. No tenderness.  Lymphadenopathy:     Cervical: No cervical adenopathy.    Skin:    Capillary Refill: Capillary refill takes less than 2 seconds.     Coloration: Skin is not jaundiced or pale.     Findings: No rash.  Neurological:     General: No focal deficit present.     Mental Status: She is alert and oriented to person, place, and time.      UC Treatments / Results  Labs (all labs ordered are listed, but only abnormal results are displayed) Labs Reviewed  NOVEL CORONAVIRUS, NAA    EKG   Radiology No results found.  Procedures Procedures (including critical care time)  Medications Ordered in UC Medications - No data to display  Initial Impression / Assessment and Plan / UC Course  I have reviewed the triage vital signs and the nursing notes.  Pertinent labs & imaging results that were available during my care of the patient were reviewed by me and considered in my medical decision making (see chart for details).     Patient afebrile, nontoxic, with SpO2 98%.  Covid PCR pending.  Patient to quarantine until results are back.  We will treat supportively as outlined below.  Return precautions discussed, patient verbalized understanding and is agreeable to plan. Final Clinical Impressions(s) / UC Diagnoses   Final diagnoses:  Encounter for screening for COVID-19  URI with cough and congestion     Discharge Instructions     Tessalon for cough. Start flonase, atrovent nasal spray for nasal congestion/drainage. You can use over the counter nasal saline rinse such as neti pot for nasal congestion. Keep hydrated, your urine should be clear to pale yellow in color. Tylenol/motrin for fever and pain. Monitor for any worsening of symptoms, chest pain, shortness of breath, wheezing, swelling of the throat, go to the emergency department for further evaluation needed.     ED Prescriptions    Medication Sig Dispense Auth. Provider   cetirizine (ZYRTEC ALLERGY) 10 MG tablet Take 1 tablet (10 mg total) by mouth daily. 30 tablet Hall-Potvin,  [place], [place]  fluticasone (FLONASE) 50 MCG/ACT nasal spray Place 1 spray into both nostrils daily. 16 g Hall-Potvin, [place], PA-C   benzonatate (TESSALON) 100 MG capsule Take 1 capsule (100 mg total) by mouth every 8 (eight) hours. 21 capsule Hall-Potvin, [place], PA-C     PDMP not reviewed this encounter.   Hall-Potvin, [place], [place] 01/11/20 1336

## 2020-01-14 LAB — NOVEL CORONAVIRUS, NAA: SARS-CoV-2, NAA: DETECTED — AB

## 2020-01-15 ENCOUNTER — Telehealth: Payer: Self-pay | Admitting: Unknown Physician Specialty

## 2020-01-15 NOTE — Telephone Encounter (Signed)
Called to Discuss with patient about Covid symptoms and the use of the monoclonal antibody infusion for those with mild to moderate Covid symptoms and at a high risk of hospitalization.     Pt appears to qualify for this infusion due to co-morbid conditions and/or a member of an at-risk group in accordance with the FDA Emergency Use Authorization.    Unable to reach pt    

## 2021-11-07 ENCOUNTER — Telehealth: Payer: Self-pay | Admitting: Plastic Surgery

## 2021-11-07 NOTE — Telephone Encounter (Signed)
Left message for patient to call for change in appointment. 

## 2021-11-07 NOTE — Telephone Encounter (Signed)
Spoke with patient and she would like to see Dr. Domenica Reamer 7/28 at 2:20 pm.  Note sent to office to make change in the computer.

## 2021-11-11 ENCOUNTER — Institutional Professional Consult (permissible substitution): Payer: BLUE CROSS/BLUE SHIELD | Admitting: Plastic Surgery

## 2021-11-27 ENCOUNTER — Institutional Professional Consult (permissible substitution): Payer: BLUE CROSS/BLUE SHIELD | Admitting: Plastic Surgery

## 2022-01-27 ENCOUNTER — Telehealth: Payer: Self-pay | Admitting: Plastic Surgery

## 2022-01-27 ENCOUNTER — Ambulatory Visit: Payer: BLUE CROSS/BLUE SHIELD | Admitting: Plastic Surgery

## 2022-01-27 VITALS — BP 145/85 | HR 66 | Ht 66.0 in | Wt 214.8 lb

## 2022-01-27 DIAGNOSIS — R21 Rash and other nonspecific skin eruption: Secondary | ICD-10-CM

## 2022-01-27 DIAGNOSIS — M25511 Pain in right shoulder: Secondary | ICD-10-CM | POA: Diagnosis not present

## 2022-01-27 DIAGNOSIS — Z6834 Body mass index (BMI) 34.0-34.9, adult: Secondary | ICD-10-CM

## 2022-01-27 DIAGNOSIS — M545 Low back pain, unspecified: Secondary | ICD-10-CM

## 2022-01-27 DIAGNOSIS — Z803 Family history of malignant neoplasm of breast: Secondary | ICD-10-CM

## 2022-01-27 DIAGNOSIS — M25512 Pain in left shoulder: Secondary | ICD-10-CM

## 2022-01-27 DIAGNOSIS — M546 Pain in thoracic spine: Secondary | ICD-10-CM

## 2022-01-27 DIAGNOSIS — G8929 Other chronic pain: Secondary | ICD-10-CM

## 2022-01-27 DIAGNOSIS — M542 Cervicalgia: Secondary | ICD-10-CM | POA: Diagnosis not present

## 2022-01-27 DIAGNOSIS — N62 Hypertrophy of breast: Secondary | ICD-10-CM

## 2022-01-27 NOTE — Addendum Note (Signed)
Addended by: Tresa Moore on: 01/27/2022 04:36 PM   Modules accepted: Orders

## 2022-01-27 NOTE — Progress Notes (Signed)
Referring Provider No referring provider defined for this encounter.   CC:  Breast hypertrophy and abdominal pannus   Sarah Roach is an 41 y.o. female.  HPI:   The patient is a 41 y.o. female with a history of mammary hyperplasia for several years.  She has extremely large breasts causing symptoms that include the following: Back pain in the upper and lower back, including neck pain. She pulls or pins her bra straps to provide better lift and relief of the pressure and pain. She notices relief by holding her breast up manually.  Her shoulder straps cause grooves and pain and pressure that requires padding for relief. Pain medication is sometimes required with motrin and tylenol.  Activities that are hindered by enlarged breasts include: exercise and running.  She has tried supportive clothing as well as fitted bras without improvement.     Mammogram history: None due to age.  Family history of breast cancer: Mother and grandmother.  Tobacco use: None.   The patient expresses the desire to pursue surgical intervention.  The BMI = 34.  Preoperative bra size = triple D cup.   She is also very interested in abdominal panniculectomy she has back pain and rashes.  She uses nystatin cream.  She had aggressive Treva 2019.  She is lost over 100 pounds and her weight is now stable.  No Known Allergies  Outpatient Encounter Medications as of 01/27/2022  Medication Sig   Melatonin 10 MG TABS Take 20 mg by mouth at bedtime.   [DISCONTINUED] benzonatate (TESSALON) 100 MG capsule Take 1 capsule (100 mg total) by mouth every 8 (eight) hours.   [DISCONTINUED] cetirizine (ZYRTEC ALLERGY) 10 MG tablet Take 1 tablet (10 mg total) by mouth daily.   [DISCONTINUED] Cyanocobalamin (VITAMIN B-12 PO) Take 1 tablet by mouth daily.   [DISCONTINUED] fluticasone (FLONASE) 50 MCG/ACT nasal spray Place 1 spray into both nostrils daily.   [DISCONTINUED] ibuprofen (ADVIL,MOTRIN) 800 MG tablet Take 1 tablet  (800 mg total) by mouth 3 (three) times daily.   No facility-administered encounter medications on file as of 01/27/2022.     Past Medical History:  Diagnosis Date   Depression    Obesity     No past surgical history on file.  Family History  Problem Relation Age of Onset   Depression Mother    Healthy Father     Social History   Social History Narrative   Not on file     Review of Systems General: Denies fevers, chills, weight loss CV: Denies chest pain, shortness of breath, palpitations   Physical Exam    01/27/2022    2:04 PM 01/11/2020   12:15 PM 11/20/2018    4:29 PM  Vitals with BMI  Height 5\' 6"     Weight 214 lbs 13 oz    BMI [id number]    Systolic [id number]  Diastolic 85 83 69  Pulse 66  59    General:  No acute distress,  Alert and oriented, Non-Toxic, Normal speech and affect Breast: No easily palpable breast masses on physical exam, significant breast ptosis and macromastia. Her breasts are extremely large and fairly symmetric.  She has hyperpigmentation of the inframammary area on both sides.  The sternal to nipple distance on the right is 37 cm and the left is 37 cm.  The IMF distance is 13 cm on the right and 14 cm on the left.  Base width is 18 bilaterally.  Abdomen: Abdominal pannus hangs  below the pubic symphysis. Assessment/Plan   The patient has bilateral symptomatic macromastia.  She is a good candidate for a breast reduction.  I believe her insurance requires her to fill physical therapy so we will put an order for this.  She is interested in pursuing surgical treatment.  She has tried supportive garments and fitted bras with no relief.  The details of breast reduction surgery were discussed.  I explained the procedure in detail along the with the expected scars.  The risks were discussed in detail and include bleeding, infection, damage to surrounding structures, need for additional procedures, nipple loss, change in nipple sensation, persistent  pain, contour irregularities and asymmetries.  I explained that breast feeding is often not possible after breast reduction surgery.  We discussed the expected postoperative course with an overall recovery period of about 1 month.  She demonstrated full understanding of all risks.  The patient is interested in pursuing surgical treatment.  The estimated excess breast tissue to be removed at the time of surgery = 700-900 grams on the left and 700-900 grams on the right.   She is also a good candidate for abdominal panniculectomy to improve her rashes.  She may have additional cosmetic benefit if she performs abdominoplasty. Lennice Sites 01/27/2022, 2:56 PM

## 2022-01-27 NOTE — Telephone Encounter (Signed)
Called and lvm for patient about copay needed for todays visit and she returned my call and pay the $50.00 copay

## 2022-02-04 ENCOUNTER — Ambulatory Visit: Payer: BLUE CROSS/BLUE SHIELD | Attending: Plastic Surgery | Admitting: Physical Therapy

## 2022-02-04 ENCOUNTER — Encounter: Payer: Self-pay | Admitting: Physical Therapy

## 2022-02-04 DIAGNOSIS — G8929 Other chronic pain: Secondary | ICD-10-CM | POA: Insufficient documentation

## 2022-02-04 DIAGNOSIS — M5459 Other low back pain: Secondary | ICD-10-CM | POA: Insufficient documentation

## 2022-02-04 DIAGNOSIS — N62 Hypertrophy of breast: Secondary | ICD-10-CM | POA: Diagnosis not present

## 2022-02-04 DIAGNOSIS — R293 Abnormal posture: Secondary | ICD-10-CM | POA: Diagnosis present

## 2022-02-04 DIAGNOSIS — M546 Pain in thoracic spine: Secondary | ICD-10-CM | POA: Insufficient documentation

## 2022-02-04 NOTE — Therapy (Signed)
OUTPATIENT PHYSICAL THERAPY THORACOLUMBAR EVALUATION   Patient Name: Sarah Roach MRN: [id number] DOB:Aug 24, 1980, 41 y.o., female Today's Date: 02/04/2022   PT End of Session - 02/04/22 0852     Visit Number 1    Number of Visits 6    Date for PT Re-Evaluation 03/25/22    PT Start Time 0850    PT Stop Time 0930    PT Time Calculation (min) 40 min    Activity Tolerance Patient tolerated treatment well    Behavior During Therapy [facility] for tasks assessed/performed             Past Medical History:  Diagnosis Date   Depression    Obesity    History reviewed. No pertinent surgical history. There are no problems to display for this patient.   PCP: [facility]  REFERRING PROVIDER: Henrene Dodge DIAG: N62 (ICD-10-CM) - Breast hypertrophy M54.6,G89.29 (ICD-10-CM) - Chronic bilateral thoracic back pain   Rationale for Evaluation and Treatment Rehabilitation  THERAPY DIAG:  Other low back pain  Abnormal posture  ONSET DATE: chronic   SUBJECTIVE:                                                                                                                                                                                           SUBJECTIVE STATEMENT:  Patient presents with chronic low back pain for many years.  She has seen plastic surgery for breast reduction and would like to have this addressed before having the surgery.   She recently lost about 100 lbs over 2 years and weight is stable. This did not have a great effect on her back pain but it did help her knees.    Pain radiates from low back to legs intermittently but lumbar pain is constant.  Her doctor gave her exercises but limited help.  Pain worsens throughout the day with prolonged sitting and standing.  She can stand 30-45 min before back and legs ache.   PERTINENT HISTORY:  Bariatric surgery  PAIN:  Are you having pain? Yes: NPRS scale: 2/10 Pain location: low back, L side > R today   Pain description: sore , radiates at times to hips, thighs  Aggravating factors: prolonged  Relieving factors: light stretching, heating pad, Tylenol   Max end of day 7/10-8/10  PRECAUTIONS: Other: none   WEIGHT BEARING RESTRICTIONS No  FALLS:  Has patient fallen in last 6 months? No  LIVING ENVIRONMENT: Lives with: lives  with mom and teenagers and grandbaby Lives in: House/apartment Stairs: yes but no issues  Has following equipment at home: None  OCCUPATION: Works full time  at the [employer]  PLOF: Independent, Vocation/Vocational requirements: sedentary, and Leisure: family, friends, working out  PATIENT GOALS:  Patient wants to alleviate the pain.   OBJECTIVE:   DIAGNOSTIC FINDINGS:  None   PATIENT SURVEYS:  FOTO 42%    COGNITION:  Overall cognitive status: Within functional limits for tasks assessed     SENSATION: WFL  MUSCLE LENGTH: Hamstrings: WFL Thomas test: Right NT  deg; Left NT deg  POSTURE: rounded shoulders, forward head, increased lumbar lordosis, increased thoracic kyphosis, and genu recurvatum , ant pelvic tilt  PALPATION:  TTP in L4-L5 and along superior glutes , L>R  LUMBAR ROM:   Active  A/PROM  eval  Flexion 25% limited pain on L   Extension WFL feels good  Right lateral flexion WNL pain on L   Left lateral flexion WNL pain on L   Right rotation WNL  Left rotation WNL    (Blank rows = not tested)  LOWER EXTREMITY ROM:     Active  Right eval Left eval  Hip flexion    Hip extension    Hip abduction    Hip adduction    Hip internal rotation    Hip external rotation    Knee flexion    Knee extension    Ankle dorsiflexion    Ankle plantarflexion    Ankle inversion    Ankle eversion     (Blank rows = not tested)  LOWER EXTREMITY MMT:    MMT Right eval Left eval  Hip flexion 5 5  Hip extension 4+ 4+  Hip abduction 4+ 4+  Hip adduction    Hip internal rotation    Hip external rotation    Knee flexion 5 5  Knee  extension 5 5  Ankle dorsiflexion 5 5  Ankle plantarflexion    Ankle inversion    Ankle eversion     (Blank rows = not tested)   Core strength is 4-/5, poor upper and lower ab activation  LUMBAR SPECIAL TESTS:  Straight leg raise test: Negative and Trendelenburg sign: Negative  FUNCTIONAL TESTS:  NT  GAIT: Distance walked: 150 Assistive device utilized: None Level of assistance: Complete Independence Comments: no deviations     TODAY'S TREATMENT  PT eval , HEP, education, self care    PATIENT EDUCATION:  Education details: posture, HEP, radicular pain  Person educated: Patient Education method: Consulting civil engineer, Media planner, Verbal cues, and Handouts Education comprehension: verbalized understanding and needs further education   HOME EXERCISE PROGRAM: Access Code: [email or web address] URL: [email or web address] Date: 02/04/2022 Prepared by: Raeford Razor  Exercises - Supine Posterior Pelvic Tilt  - 1 x daily - 7 x weekly - 2 sets - 10 reps - Hooklying Transversus Abdominis Palpation  - 1 x daily - 7 x weekly - 2 sets - 10 reps - 5 hold - Bent Knee Fallouts  - 1 x daily - 7 x weekly - 2 sets - 10 reps - Supine 90/90 Abdominal Bracing  - 1 x daily - 7 x weekly - 5 reps - 30 hold - Seated Piriformis Stretch with Trunk Bend  - 1 x daily - 7 x weekly - 5 reps - 30 hold  ASSESSMENT:  CLINICAL IMPRESSION: Patient is a 41 y.o. female who was seen today for physical therapy evaluation and treatment for preparation for breast reduction. She has had chronic back pain for many years, limiting her ability to work and be physically active in order to improve her health.     OBJECTIVE IMPAIRMENTS  difficulty walking, decreased strength, hypomobility, increased fascial restrictions, postural dysfunction, obesity, and pain. , core weakness  ACTIVITY LIMITATIONS carrying, bending, sitting, standing, squatting, bed mobility, and locomotion level  PARTICIPATION LIMITATIONS:  cleaning, interpersonal relationship, shopping, community activity, occupation, and fitness   PERSONAL FACTORS Time since onset of injury/illness/exacerbation and 1 comorbidity: obesity with bariatric surgery  are also affecting patient's functional outcome.   REHAB POTENTIAL: Excellent  CLINICAL DECISION MAKING: Stable/uncomplicated  EVALUATION COMPLEXITY: Low   GOALS: Goals reviewed with patient? Yes  LONG TERM GOALS: Target date: 03/18/2022  Pt will be I with HEP for posture, core strength  Baseline:  Goal status: INITIAL  2.  Pt will be able to walk for fitness, up to 3 miles with min limitation due to pain  Baseline:  Goal status: INITIAL  3.  Patient will report min to no LE pain (centralization) with most ADLs and mobility tasks.  Baseline:  Goal status: INITIAL  4.  Pt will report 25% improvement in overall pain in low back after work Baseline:  Goal status: INITIAL    PLAN: PT FREQUENCY: 1x/week  PT DURATION: 6 weeks  PLANNED INTERVENTIONS: Therapeutic exercises, Therapeutic activity, Neuromuscular re-education, Balance training, Gait training, Patient/Family education, Self Care, Joint mobilization, Dry Needling, Spinal mobilization, Cryotherapy, Moist heat, Taping, Manual therapy, and Re-evaluation.  PLAN FOR NEXT SESSION: check HEP, standing scapular ex. UBE     Raeford Razor, PT 02/04/22 7:26 PM Phone: [phone] Fax: [phone]  Argelia Formisano, PT 02/04/2022, 7:15 PM

## 2022-02-11 ENCOUNTER — Encounter: Payer: Self-pay | Admitting: Physical Therapy

## 2022-02-11 ENCOUNTER — Ambulatory Visit: Payer: BLUE CROSS/BLUE SHIELD | Admitting: Physical Therapy

## 2022-02-11 DIAGNOSIS — R293 Abnormal posture: Secondary | ICD-10-CM

## 2022-02-11 DIAGNOSIS — M5459 Other low back pain: Secondary | ICD-10-CM | POA: Diagnosis not present

## 2022-02-11 NOTE — Therapy (Signed)
OUTPATIENT PHYSICAL THERAPY TREATMENT NOTE   Patient Name: Sarah Roach MRN: [id number] DOB:25-Aug-1980, 41 y.o., female Today's Date: 02/11/2022  PCP: NA REFERRING PROVIDER: Lennice Sites MD  END OF SESSION:   PT End of Session - 02/11/22 0804     Visit Number 2    Number of Visits 6    Date for PT Re-Evaluation 03/25/22    PT Start Time 0800    PT Stop Time 0845    PT Time Calculation (min) 45 min    Activity Tolerance Patient tolerated treatment well    Behavior During Therapy [facility] for tasks assessed/performed             Past Medical History:  Diagnosis Date   Depression    Obesity    History reviewed. No pertinent surgical history. There are no problems to display for this patient.   REFERRING DIAG: N62 (ICD-10-CM) - Breast hypertrophy M54.6,G89.29 (ICD-10-CM) - Chronic bilateral thoracic back pain   THERAPY DIAG:  Other low back pain  Abnormal posture  Rationale for Evaluation and Treatment Rehabilitation  PERTINENT HISTORY: see above , none   PRECAUTIONS: none   SUBJECTIVE: Its early so it OK right now. The more I sit the worse it is.  PAIN:  Are you having pain? Yes: NPRS scale: 4/10 Pain location: low back  Pain description: pain  Aggravating factors: walking  Relieving factors: not sure    OBJECTIVE: (objective measures completed at initial evaluation unless otherwise dated)  DIAGNOSTIC FINDINGS:  None    PATIENT SURVEYS:  FOTO 42%       COGNITION:           Overall cognitive status: Within functional limits for tasks assessed                          SENSATION: WFL   MUSCLE LENGTH: Hamstrings: WFL Thomas test: Right NT         deg; Left NT deg   POSTURE: rounded shoulders, forward head, increased lumbar lordosis, increased thoracic kyphosis, and genu recurvatum , ant pelvic tilt   PALPATION:  TTP in L4-L5 and along superior glutes , L>R   LUMBAR ROM:    Active  A/PROM  eval  Flexion 25% limited pain on L    Extension WFL feels good  Right lateral flexion WNL pain on L   Left lateral flexion WNL pain on L   Right rotation WNL  Left rotation WNL    (Blank rows = not tested)   LOWER EXTREMITY ROM:      Active  Right eval Left eval  Hip flexion      Hip extension      Hip abduction      Hip adduction      Hip internal rotation      Hip external rotation      Knee flexion      Knee extension      Ankle dorsiflexion      Ankle plantarflexion      Ankle inversion      Ankle eversion       (Blank rows = not tested)   LOWER EXTREMITY MMT:     MMT Right eval Left eval  Hip flexion 5 5  Hip extension 4+ 4+  Hip abduction 4+ 4+  Hip adduction      Hip internal rotation      Hip external rotation      Knee flexion  5 5  Knee extension 5 5  Ankle dorsiflexion 5 5  Ankle plantarflexion      Ankle inversion      Ankle eversion       (Blank rows = not tested)             Core strength is 4-/5, poor upper and lower ab activation   LUMBAR SPECIAL TESTS:  Straight leg raise test: Negative and Trendelenburg sign: Negative   FUNCTIONAL TESTS:  NT   GAIT: Distance walked: 150 Assistive device utilized: None Level of assistance: Complete Independence Comments: no deviations        TODAY'S TREATMENT  PT eval , HEP, education, self care      [facility] Adult PT Treatment:                                                DATE: 02/11/22 Therapeutic Exercise: Nustep L8 UE and LE  x 6 min  Supine HEP review: post pelvic tilt, Transverse Abd x 10  Alternating bent knee fall out x 10 each  Ball press for core activation added oblique press with opp arm opp leg Isometric 90/90 hold , then alternating leg extension for core challenge  Supine ball bridges, ball under lower legs x 10  Hamstring stretch 30 sec x  2 bilateral , ITB x 1  Piriformis seated 2 x 30 sec  Quadruped cat/cow x 5  Child pose lateral and forward    PATIENT EDUCATION:  Education details: posture, HEP, radicular  pain  Person educated: Patient Education method: Programmer, multimedia, Demonstration, Verbal cues, and Handouts Education comprehension: verbalized understanding and needs further education     HOME EXERCISE PROGRAM: Access Code: [email or web address] URL: [email or web address] Date: 02/04/2022 Prepared by: Karie Mainland   Exercises - Supine Posterior Pelvic Tilt  - 1 x daily - 7 x weekly - 2 sets - 10 reps - Hooklying Transversus Abdominis Palpation  - 1 x daily - 7 x weekly - 2 sets - 10 reps - 5 hold - Bent Knee Fallouts  - 1 x daily - 7 x weekly - 2 sets - 10 reps - Supine 90/90 Abdominal Bracing  - 1 x daily - 7 x weekly - 5 reps - 30 hold - Seated Piriformis Stretch with Trunk Bend  - 1 x daily - 7 x weekly - 5 reps - 30 hold   ASSESSMENT:   CLINICAL IMPRESSION: Patient tolerated session well, provided core stability and hip flexibility exercises. Back pain reduced to , 2/10 post session.  Stiffness noted in thoracolumbar spine with cat and camel, needed moderate cues for this. Cont POC.      OBJECTIVE IMPAIRMENTS difficulty walking, decreased strength, hypomobility, increased fascial restrictions, postural dysfunction, obesity, and pain. , core weakness   ACTIVITY LIMITATIONS carrying, bending, sitting, standing, squatting, bed mobility, and locomotion level   PARTICIPATION LIMITATIONS: cleaning, interpersonal relationship, shopping, community activity, occupation, and fitness    PERSONAL FACTORS Time since onset of injury/illness/exacerbation and 1 comorbidity: obesity with bariatric surgery  are also affecting patient's functional outcome.    REHAB POTENTIAL: Excellent   CLINICAL DECISION MAKING: Stable/uncomplicated   EVALUATION COMPLEXITY: Low     GOALS: Goals reviewed with patient? Yes   LONG TERM GOALS: Target date: 03/18/2022   Pt will be I with HEP for posture, core strength  Baseline:  Goal status:  INITIAL   2.  Pt will be able to walk for fitness, up to 3  miles with min limitation due to pain  Baseline:  Goal status: INITIAL   3.  Patient will report min to no LE pain (centralization) with most ADLs and mobility tasks.  Baseline:  Goal status: INITIAL   4.  Pt will report 25% improvement in overall pain in low back after work Baseline:  Goal status: INITIAL       PLAN: PT FREQUENCY: 1x/week   PT DURATION: 6 weeks   PLANNED INTERVENTIONS: Therapeutic exercises, Therapeutic activity, Neuromuscular re-education, Balance training, Gait training, Patient/Family education, Self Care, Joint mobilization, Dry Needling, Spinal mobilization, Cryotherapy, Moist heat, Taping, Manual therapy, and Re-evaluation.   PLAN FOR NEXT SESSION: check HEP, standing scapular ex. UBE        Karie Mainland, PT 02/04/22 7:26 PM Phone: [phone] Fax: [phone]     Sarah Roach, PT 02/11/2022, 8:09 AM

## 2022-02-17 NOTE — Therapy (Unsigned)
OUTPATIENT PHYSICAL THERAPY TREATMENT NOTE   Patient Name: Sarah Roach MRN: [id number] DOB:04/02/1981, 41 y.o., female Today's Date: 02/18/2022  PCP: NA REFERRING PROVIDER: Janne Napoleon MD  END OF SESSION:   PT End of Session - 02/18/22 0804     Visit Number 3    Number of Visits 6    Date for PT Re-Evaluation 03/25/22    Authorization Type BCBS    PT Start Time 0803    PT Stop Time 0855    PT Time Calculation (min) 52 min    Activity Tolerance Patient tolerated treatment well    Behavior During Therapy [facility] for tasks assessed/performed              Past Medical History:  Diagnosis Date   Depression    Obesity    History reviewed. No pertinent surgical history. There are no problems to display for this patient.   REFERRING DIAG: N62 (ICD-10-CM) - Breast hypertrophy M54.6,G89.29 (ICD-10-CM) - Chronic bilateral thoracic back pain   THERAPY DIAG:  Other low back pain  Abnormal posture  Rationale for Evaluation and Treatment Rehabilitation  PERTINENT HISTORY: see above , none   PRECAUTIONS: none   SUBJECTIVE: Its early so it OK right now. The more I sit the worse it is.  PAIN:  Are you having pain? Yes: NPRS scale: 4/10 Pain location: low back  Pain description: pain  Aggravating factors: walking  Relieving factors: not sure    OBJECTIVE: (objective measures completed at initial evaluation unless otherwise dated)  DIAGNOSTIC FINDINGS:  None    PATIENT SURVEYS:  FOTO 42%       COGNITION:           Overall cognitive status: Within functional limits for tasks assessed                          SENSATION: WFL   MUSCLE LENGTH: Hamstrings: WFL Thomas test: Right NT         deg; Left NT deg   POSTURE: rounded shoulders, forward head, increased lumbar lordosis, increased thoracic kyphosis, and genu recurvatum , ant pelvic tilt   PALPATION:  TTP in L4-L5 and along superior glutes , L>R   LUMBAR ROM:    Active  A/PROM  eval   Flexion 25% limited pain on L   Extension WFL feels good  Right lateral flexion WNL pain on L   Left lateral flexion WNL pain on L   Right rotation WNL  Left rotation WNL    (Blank rows = not tested)   LOWER EXTREMITY ROM:      Active  Right eval Left eval  Hip flexion      Hip extension      Hip abduction      Hip adduction      Hip internal rotation      Hip external rotation      Knee flexion      Knee extension      Ankle dorsiflexion      Ankle plantarflexion      Ankle inversion      Ankle eversion       (Blank rows = not tested)   LOWER EXTREMITY MMT:     MMT Right eval Left eval  Hip flexion 5 5  Hip extension 4+ 4+  Hip abduction 4+ 4+  Hip adduction      Hip internal rotation      Hip external rotation  Knee flexion 5 5  Knee extension 5 5  Ankle dorsiflexion 5 5  Ankle plantarflexion      Ankle inversion      Ankle eversion       (Blank rows = not tested)             Core strength is 4-/5, poor upper and lower ab activation   LUMBAR SPECIAL TESTS:  Straight leg raise test: Negative and Trendelenburg sign: Negative   FUNCTIONAL TESTS:  NT   GAIT: Distance walked: 150 Assistive device utilized: None Level of assistance: Complete Independence Comments: no deviations        TODAY'S TREATMENT  PT eval , HEP, education, self care   [facility] Adult PT Treatment:                                                DATE: 02/17/22 Therapeutic Exercise: Nustep L 5 UE and LE for 6 min  Cat and camel mod cues , to childs pose  Quadruped hip extension x 5 each side  added opposite arm x 1 for demo  Dynadisc under pelvis for lower abdominals  Post tilt with exhale Shoulder extension green TB x 10 March, cues to avoid post tilt  Shoulder extension hold with alt. March x  5 slow  SLR x 10 each side with disc (core focus)  Articulating bridge x 10 , painful , modified  Hip stretching : knee to chest , figure 4  Row , extension blue band x 15 each   Manual Therapy: 8 min MHP to lumbar [supine)   [facility] Adult PT Treatment:                                                DATE: 02/11/22 Therapeutic Exercise: Nustep L8 UE and LE  x 6 min  Supine HEP review: post pelvic tilt, Transverse Abd x 10  Alternating bent knee fall out x 10 each  Ball press for core activation added oblique press with opp arm opp leg Isometric 90/90 hold , then alternating leg extension for core challenge  Supine ball bridges, ball under lower legs x 10  Hamstring stretch 30 sec x  2 bilateral , ITB x 1  Piriformis seated 2 x 30 sec  Quadruped cat/cow x 5  Child pose lateral and forward    PATIENT EDUCATION:  Education details: posture, HEP, radicular pain  Person educated: Patient Education method: Consulting civil engineer, Demonstration, Verbal cues, and Handouts Education comprehension: verbalized understanding and needs further education     HOME EXERCISE PROGRAM: Access Code: [email or web address] URL: [email or web address] Date: 02/04/2022 Prepared by: Raeford Razor   Exercises - Supine Posterior Pelvic Tilt  - 1 x daily - 7 x weekly - 2 sets - 10 reps - Hooklying Transversus Abdominis Palpation  - 1 x daily - 7 x weekly - 2 sets - 10 reps - 5 hold - Bent Knee Fallouts  - 1 x daily - 7 x weekly - 2 sets - 10 reps - Supine 90/90 Abdominal Bracing  - 1 x daily - 7 x weekly - 5 reps - 30 hold - Seated Piriformis Stretch with Trunk Bend  - 1 x daily - 7 x weekly - 5  reps - 30 hold Row, extension blue band   ASSESSMENT:   CLINICAL IMPRESSION: Patient continues to have daily intermittent pain in low back, increasing as the day goes on.  Focus cont to be core and trunk stability with exercises.  MHP to reduce back discomfort after session.      OBJECTIVE IMPAIRMENTS difficulty walking, decreased strength, hypomobility, increased fascial restrictions, postural dysfunction, obesity, and pain. , core weakness   ACTIVITY LIMITATIONS carrying, bending, sitting,  standing, squatting, bed mobility, and locomotion level   PARTICIPATION LIMITATIONS: cleaning, interpersonal relationship, shopping, community activity, occupation, and fitness    PERSONAL FACTORS Time since onset of injury/illness/exacerbation and 1 comorbidity: obesity with bariatric surgery  are also affecting patient's functional outcome.    REHAB POTENTIAL: Excellent   CLINICAL DECISION MAKING: Stable/uncomplicated   EVALUATION COMPLEXITY: Low     GOALS: Goals reviewed with patient? Yes   LONG TERM GOALS: Target date: 03/18/2022   Pt will be I with HEP for posture, core strength  Baseline:  Goal status: INITIAL   2.  Pt will be able to walk for fitness, up to 3 miles with min limitation due to pain  Baseline:  Goal status: INITIAL   3.  Patient will report min to no LE pain (centralization) with most ADLs and mobility tasks.  Baseline:  Goal status: INITIAL   4.  Pt will report 25% improvement in overall pain in low back after work Baseline:  Goal status: INITIAL       PLAN: PT FREQUENCY: 1x/week   PT DURATION: 6 weeks   PLANNED INTERVENTIONS: Therapeutic exercises, Therapeutic activity, Neuromuscular re-education, Balance training, Gait training, Patient/Family education, Self Care, Joint mobilization, Dry Needling, Spinal mobilization, Cryotherapy, Moist heat, Taping, Manual therapy, and Re-evaluation.   PLAN FOR NEXT SESSION: check HEP, standing scapular ex. UBE        Karie Mainland, PT 02/04/22 7:26 PM Phone: [phone] Fax: [phone]     Indiah Heyden, PT 02/18/2022, 8:47 AM

## 2022-02-18 ENCOUNTER — Encounter: Payer: Self-pay | Admitting: Physical Therapy

## 2022-02-18 ENCOUNTER — Ambulatory Visit: Payer: BLUE CROSS/BLUE SHIELD | Admitting: Physical Therapy

## 2022-02-18 DIAGNOSIS — M5459 Other low back pain: Secondary | ICD-10-CM | POA: Diagnosis not present

## 2022-02-18 DIAGNOSIS — R293 Abnormal posture: Secondary | ICD-10-CM

## 2022-02-23 ENCOUNTER — Ambulatory Visit: Payer: BLUE CROSS/BLUE SHIELD | Admitting: Physical Therapy

## 2022-02-23 ENCOUNTER — Encounter: Payer: Self-pay | Admitting: Physical Therapy

## 2022-02-23 DIAGNOSIS — M5459 Other low back pain: Secondary | ICD-10-CM

## 2022-02-23 DIAGNOSIS — R293 Abnormal posture: Secondary | ICD-10-CM

## 2022-02-23 NOTE — Therapy (Signed)
OUTPATIENT PHYSICAL THERAPY TREATMENT NOTE   Patient Name: Sarah Roach MRN: [id number] DOB:1980-12-13, 41 y.o., female Today's Date: 02/23/2022  PCP: NA REFERRING PROVIDER: Lennice Sites MD  END OF SESSION:   PT End of Session - 02/23/22 0719     Visit Number 4    Number of Visits 6    Date for PT Re-Evaluation 03/25/22    Authorization Type BCBS    PT Start Time 0718    PT Stop Time 0800    PT Time Calculation (min) 42 min              Past Medical History:  Diagnosis Date   Depression    Obesity    History reviewed. No pertinent surgical history. There are no problems to display for this patient.   REFERRING DIAG: N62 (ICD-10-CM) - Breast hypertrophy M54.6,G89.29 (ICD-10-CM) - Chronic bilateral thoracic back pain   THERAPY DIAG:  Other low back pain  Abnormal posture  Rationale for Evaluation and Treatment Rehabilitation  PERTINENT HISTORY: see above , none   PRECAUTIONS: none   SUBJECTIVE: No pain this morning.   PAIN:  Are you having pain? Yes: NPRS scale: 0/10 Pain location: low back  Pain description: pain  Aggravating factors: walking  Relieving factors: not sure    OBJECTIVE: (objective measures completed at initial evaluation unless otherwise dated)  DIAGNOSTIC FINDINGS:  None    PATIENT SURVEYS:  FOTO 42%       COGNITION:           Overall cognitive status: Within functional limits for tasks assessed                          SENSATION: WFL   MUSCLE LENGTH: Hamstrings: WFL Thomas test: Right NT         deg; Left NT deg   POSTURE: rounded shoulders, forward head, increased lumbar lordosis, increased thoracic kyphosis, and genu recurvatum , ant pelvic tilt   PALPATION:  TTP in L4-L5 and along superior glutes , L>R   LUMBAR ROM:    Active  A/PROM  eval AROM 02/23/22  Flexion 25% limited pain on L    Extension WFL feels good   Right lateral flexion WNL pain on L  WNL no pain   Left lateral flexion WNL pain  on L  WNL pain on left  Right rotation WNL   Left rotation WNL     (Blank rows = not tested)   LOWER EXTREMITY ROM:      Active  Right eval Left eval  Hip flexion      Hip extension      Hip abduction      Hip adduction      Hip internal rotation      Hip external rotation      Knee flexion      Knee extension      Ankle dorsiflexion      Ankle plantarflexion      Ankle inversion      Ankle eversion       (Blank rows = not tested)   LOWER EXTREMITY MMT:     MMT Right eval Left eval  Hip flexion 5 5  Hip extension 4+ 4+  Hip abduction 4+ 4+  Hip adduction      Hip internal rotation      Hip external rotation      Knee flexion 5 5  Knee extension 5 5  Ankle dorsiflexion 5 5  Ankle plantarflexion      Ankle inversion      Ankle eversion       (Blank rows = not tested)             Core strength is 4-/5, poor upper and lower ab activation   LUMBAR SPECIAL TESTS:  Straight leg raise test: Negative and Trendelenburg sign: Negative   FUNCTIONAL TESTS:  NT   GAIT: Distance walked: 150 Assistive device utilized: None Level of assistance: Complete Independence Comments: no deviations        TODAY'S TREATMENT  PT eval , HEP, education, self care   [facility] Adult PT Treatment:                                                DATE: 02/23/22 Therapeutic Exercise: Nustep L5 UE/Le x 6 minutes  Standing Rows Green Standing Ext Green  Standing horizontal abduction Green x 15 Standing Bilat Shoulder ER x 15  90/90 ab brace 10 sec x 8 Open books x 10 each  Bridge 15 Cat/camel Childs pose    [facility] Adult PT Treatment:                                                DATE: 02/17/22 Therapeutic Exercise: Nustep L 5 UE and LE for 6 min  Cat and camel mod cues , to childs pose  Quadruped hip extension x 5 each side  added opposite arm x 1 for demo  Dynadisc under pelvis for lower abdominals  Post tilt with exhale Shoulder extension green TB x 10 March, cues to avoid  post tilt  Shoulder extension hold with alt. March x  5 slow  SLR x 10 each side with disc (core focus)  Articulating bridge x 10 , painful , modified  Hip stretching : knee to chest , figure 4  Row , extension blue band x 15 each  Manual Therapy: 8 min MHP to lumbar [supine)   [facility] Adult PT Treatment:                                                DATE: 02/11/22 Therapeutic Exercise: Nustep L8 UE and LE  x 6 min  Supine HEP review: post pelvic tilt, Transverse Abd x 10  Alternating bent knee fall out x 10 each  Ball press for core activation added oblique press with opp arm opp leg Isometric 90/90 hold , then alternating leg extension for core challenge  Supine ball bridges, ball under lower legs x 10  Hamstring stretch 30 sec x  2 bilateral , ITB x 1  Piriformis seated 2 x 30 sec  Quadruped cat/cow x 5  Child pose lateral and forward    PATIENT EDUCATION:  Education details: posture, HEP, radicular pain  Person educated: Patient Education method: Consulting civil engineer, Demonstration, Verbal cues, and Handouts Education comprehension: verbalized understanding and needs further education     HOME EXERCISE PROGRAM: Access Code: [email or web address] URL: [email or web address] Date: 02/04/2022 Prepared by: Raeford Razor   Exercises - Supine Posterior Pelvic Tilt  -  1 x daily - 7 x weekly - 2 sets - 10 reps - Hooklying Transversus Abdominis Palpation  - 1 x daily - 7 x weekly - 2 sets - 10 reps - 5 hold - Bent Knee Fallouts  - 1 x daily - 7 x weekly - 2 sets - 10 reps - Supine 90/90 Abdominal Bracing  - 1 x daily - 7 x weekly - 5 reps - 30 hold - Seated Piriformis Stretch with Trunk Bend  - 1 x daily - 7 x weekly - 5 reps - 30 hold Row, extension blue band   ASSESSMENT:   CLINICAL IMPRESSION: Patient has no pian on arrival. She has less pain with lumbar side bend to the right. She continues to have pain that radiates into buttocks and posterior thighs with prolonged standing and  walking. She walked 1 mile this week and experienced increased pain. No goals met at this time. Continued with postural and core strengthening. She did not have increased pain, only muscle fatigue/burning.     OBJECTIVE IMPAIRMENTS difficulty walking, decreased strength, hypomobility, increased fascial restrictions, postural dysfunction, obesity, and pain. , core weakness   ACTIVITY LIMITATIONS carrying, bending, sitting, standing, squatting, bed mobility, and locomotion level   PARTICIPATION LIMITATIONS: cleaning, interpersonal relationship, shopping, community activity, occupation, and fitness    PERSONAL FACTORS Time since onset of injury/illness/exacerbation and 1 comorbidity: obesity with bariatric surgery  are also affecting patient's functional outcome.    REHAB POTENTIAL: Excellent   CLINICAL DECISION MAKING: Stable/uncomplicated   EVALUATION COMPLEXITY: Low     GOALS: Goals reviewed with patient? Yes   LONG TERM GOALS: Target date: 03/18/2022   Pt will be I with HEP for posture, core strength  Baseline:  Goal status: INITIAL   2.  Pt will be able to walk for fitness, up to 3 miles with min limitation due to pain  Baseline:  Status: walked 1 mile this week and had increased pain into hamstrings.  Goal status: INITIAL   3.  Patient will report min to no LE pain (centralization) with most ADLs and mobility tasks.  Baseline:  Status: 02/23/22: increased LE pain (gluteals)  with prolonged standing or sitting Goal status: INITIAL   4.  Pt will report 25% improvement in overall pain in low back after work Baseline:  Goal status: INITIAL       PLAN: PT FREQUENCY: 1x/week   PT DURATION: 6 weeks   PLANNED INTERVENTIONS: Therapeutic exercises, Therapeutic activity, Neuromuscular re-education, Balance training, Gait training, Patient/Family education, Self Care, Joint mobilization, Dry Needling, Spinal mobilization, Cryotherapy, Moist heat, Taping, Manual therapy, and  Re-evaluation.   PLAN FOR NEXT SESSION: check HEP, standing scapular ex. [place], PTA 02/23/22 8:03 AM Phone: [phone] Fax: [phone]

## 2022-02-26 ENCOUNTER — Encounter: Payer: Self-pay | Admitting: Plastic Surgery

## 2022-02-26 ENCOUNTER — Ambulatory Visit (INDEPENDENT_AMBULATORY_CARE_PROVIDER_SITE_OTHER): Payer: BLUE CROSS/BLUE SHIELD | Admitting: Plastic Surgery

## 2022-02-26 VITALS — BP 144/87 | HR 68 | Ht 66.0 in | Wt 216.0 lb

## 2022-02-26 DIAGNOSIS — M546 Pain in thoracic spine: Secondary | ICD-10-CM

## 2022-02-26 DIAGNOSIS — E65 Localized adiposity: Secondary | ICD-10-CM

## 2022-02-26 DIAGNOSIS — Z6834 Body mass index (BMI) 34.0-34.9, adult: Secondary | ICD-10-CM

## 2022-02-26 DIAGNOSIS — M542 Cervicalgia: Secondary | ICD-10-CM | POA: Diagnosis not present

## 2022-02-26 DIAGNOSIS — N62 Hypertrophy of breast: Secondary | ICD-10-CM | POA: Diagnosis not present

## 2022-02-26 NOTE — Progress Notes (Signed)
   Referring Provider No referring provider defined for this encounter.   CC:  Chief Complaint  Patient presents with   Advice Only      Sarah Roach is an 41 y.o. female.  HPI: The patient is a 41 year old female who was previously seen by Dr. Marcy Salvo and approved for a bilateral breast reduction and panniculectomy.  No Known Allergies  Outpatient Encounter Medications as of 02/26/2022  Medication Sig   Melatonin 10 MG TABS Take 20 mg by mouth at bedtime.   No facility-administered encounter medications on file as of 02/26/2022.     Past Medical History:  Diagnosis Date   Depression    Obesity     No past surgical history on file.  Family History  Problem Relation Age of Onset   Depression Mother    Healthy Father     Social History   Social History Narrative   Not on file     Review of Systems General: Denies fevers, chills, weight loss CV: Denies chest pain, shortness of breath, palpitations Breast: Patient has large heavy and ptotic breasts.  She states that she has pain in her upper back and neck due to the size of the breast. Pannus: Patient states that she has rashes on posterior aspect of her pannus.  Physical Exam    02/26/2022   10:36 AM 01/27/2022    2:04 PM 01/11/2020   12:15 PM  Vitals with BMI  Height 5\' 6"  5\' 6"    Weight 216 lbs 214 lbs 13 oz   BMI 96.78 93.81   Systolic 017 510 258  Diastolic 87 85 83  Pulse 68 66     General:  No acute distress,  Alert and oriented, Non-Toxic, Normal speech and affect Breasts: Patient has bilateral macromastia with grade 3 ptosis.  No nipple discharge or nipple abnormalities and no palpable masses. Pannus: Patient has a large pannus which hangs below the level of the symphysis pubis.  Assessment/Plan Macromastia: The patient has bilateral heavy breasts which she feels attributes to her upper back and neck pain.  She is an acceptable candidate for bilateral breast reduction and I believe I  can remove between 500 and 700 g per breast. Pannus: The patient has large apron of fat on the anterior abdominal wall.  This can be removed at the time of her breast reduction. I reviewed the procedures with the patient including the use of drains in both the breast and the abdomen.  We reviewed the physical limitations including no heavy lifting, greater than 20 pounds, no vigorous exercise and no submerging the incisions in water for 6 weeks.  She may return to light activities as soon as she feels able.  We discussed that it may be a week before she feels comfortable returning to work.  She was given opportunity to ask any further questions and all questions were answered to her satisfaction.  We will proceed at her request.  Sarah Roach 02/26/2022, 12:01 PM

## 2022-03-02 NOTE — Therapy (Signed)
OUTPATIENT PHYSICAL THERAPY TREATMENT NOTE   Patient Name: Sarah Roach MRN: [id number] DOB:31-Jul-1980, 41 y.o., female Today's Date: 03/03/2022  PCP: NA REFERRING PROVIDER: Janne Napoleon MD  END OF SESSION:   PT End of Session - 03/03/22 1419     Visit Number 5    Number of Visits 6    Date for PT Re-Evaluation 03/25/22    Authorization Type BC[employer]    PT Start Time 1418    PT Stop Time 1500    PT Time Calculation (min) 42 min    Activity Tolerance Patient tolerated treatment well    Behavior During Therapy WFL for tasks assessed/performed               Past Medical History:  Diagnosis Date   Depression    Obesity    History reviewed. No pertinent surgical history. There are no problems to display for this patient.   REFERRING DIAG: N62 (ICD-10-CM) - Breast hypertrophy M54.6,G89.29 (ICD-10-CM) - Chronic bilateral thoracic back pain   THERAPY DIAG:  Other low back pain  Abnormal posture  Rationale for Evaluation and Treatment Rehabilitation  PERTINENT HISTORY: see above , none   PRECAUTIONS: none   SUBJECTIVE: No pain today, has been working all day.  The exercises are going well.   Pain was worse this past weekend.    PAIN:  Are you having pain? Yes: NPRS scale: 0/10 Pain location: low back  Pain description: pain  Aggravating factors: walking  Relieving factors: not sure    OBJECTIVE: (objective measures completed at initial evaluation unless otherwise dated)  DIAGNOSTIC FINDINGS:  None    PATIENT SURVEYS:  FOTO 42%       COGNITION:           Overall cognitive status: Within functional limits for tasks assessed                          SENSATION: WFL   MUSCLE LENGTH: Hamstrings: WFL Thomas test: Right NT         deg; Left NT deg   POSTURE: rounded shoulders, forward head, increased lumbar lordosis, increased thoracic kyphosis, and genu recurvatum , ant pelvic tilt   PALPATION:  TTP in L4-L5 and along superior glutes ,  L>R   LUMBAR ROM:    Active  A/PROM  eval AROM 02/23/22  Flexion 25% limited pain on L    Extension WFL feels good   Right lateral flexion WNL pain on L  WNL no pain   Left lateral flexion WNL pain on L  WNL pain on left  Right rotation WNL   Left rotation WNL     (Blank rows = not tested)   LOWER EXTREMITY ROM:      Active  Right eval Left eval  Hip flexion      Hip extension      Hip abduction      Hip adduction      Hip internal rotation      Hip external rotation      Knee flexion      Knee extension      Ankle dorsiflexion      Ankle plantarflexion      Ankle inversion      Ankle eversion       (Blank rows = not tested)   LOWER EXTREMITY MMT:     MMT Right eval Left eval  Hip flexion 5 5  Hip extension 4+ 4+  Hip abduction 4+ 4+  Hip adduction      Hip internal rotation      Hip external rotation      Knee flexion 5 5  Knee extension 5 5  Ankle dorsiflexion 5 5  Ankle plantarflexion      Ankle inversion      Ankle eversion       (Blank rows = not tested)             Core strength is 4-/5, poor upper and lower ab activation   LUMBAR SPECIAL TESTS:  Straight leg raise test: Negative and Trendelenburg sign: Negative   FUNCTIONAL TESTS:  NT   GAIT: Distance walked: 150 Assistive device utilized: None Level of assistance: Complete Independence Comments: no deviations        TODAY'S TREATMENT   [facility] Adult PT Treatment:                                                DATE: 03/03/22 Therapeutic Exercise: UBE for 5 min , level 1  Standing Rows Slastix on springboard x 10  Standing horizontal abduction x 10  Standing Bilat Shoulder abd with ER x 10  Abdominal bracing x 10 Pilates circle press x 5 opp arm/opp leg  Ball bridging x 10 x 2 sets , with and without UEs  Lower trunk rotation  x 10 with ball under legs  Open book x 3  Sidelying hip abd x 10 x 2 and clam x 15  Sit to stand x 10 , 10 lbs  Sit to stand x 10 with 10 lbs chest press x  10  High knees 10 lbs at chest x 10 each Single arm 8 lbs overhead press with high knee march  Palloff press x 10 add small rotation x 10 each side   [facility] Adult PT Treatment:                                                DATE: 02/23/22 Therapeutic Exercise: Nustep L5 UE/Le x 6 minutes  Standing Rows Green Standing Ext Green  Standing horizontal abduction Green x 15 Standing Bilat Shoulder ER x 15  90/90 ab brace 10 sec x 8 Open books x 10 each  Bridge 15 Cat/camel Childs pose   [facility] Adult PT Treatment:                                                DATE: 02/17/22 Therapeutic Exercise: Nustep L 5 UE and LE for 6 min  Cat and camel mod cues , to childs pose  Quadruped hip extension x 5 each side  added opposite arm x 1 for demo  Dynadisc under pelvis for lower abdominals  Post tilt with exhale Shoulder extension green TB x 10 March, cues to avoid post tilt  Shoulder extension hold with alt. March x  5 slow  SLR x 10 each side with disc (core focus)  Articulating bridge x 10 , painful , modified  Hip stretching : knee to chest , figure 4  Row , extension blue band  x 15 each  Manual Therapy: 8 min MHP to lumbar [supine)   [facility] Adult PT Treatment:                                                DATE: 02/11/22 Therapeutic Exercise: Nustep L8 UE and LE  x 6 min  Supine HEP review: post pelvic tilt, Transverse Abd x 10  Alternating bent knee fall out x 10 each  Ball press for core activation added oblique press with opp arm opp leg Isometric 90/90 hold , then alternating leg extension for core challenge  Supine ball bridges, ball under lower legs x 10  Hamstring stretch 30 sec x  2 bilateral , ITB x 1  Piriformis seated 2 x 30 sec  Quadruped cat/cow x 5  Child pose lateral and forward    PATIENT EDUCATION:  Education details: posture, HEP, radicular pain  Person educated: Patient Education method: Programmer, multimedia, Demonstration, Verbal cues, and Handouts Education  comprehension: verbalized understanding and needs further education     HOME EXERCISE PROGRAM: Access Code: [email or web address] URL: [email or web address] Date: 02/04/2022 Prepared by: Karie Mainland   Exercises - Supine Posterior Pelvic Tilt  - 1 x daily - 7 x weekly - 2 sets - 10 reps - Hooklying Transversus Abdominis Palpation  - 1 x daily - 7 x weekly - 2 sets - 10 reps - 5 hold - Bent Knee Fallouts  - 1 x daily - 7 x weekly - 2 sets - 10 reps - Supine 90/90 Abdominal Bracing  - 1 x daily - 7 x weekly - 5 reps - 30 hold - Seated Piriformis Stretch with Trunk Bend  - 1 x daily - 7 x weekly - 5 reps - 30 hold Row, extension blue band  Ball exercises Bridge and curl   ASSESSMENT:   CLINICAL IMPRESSION: Patient experiencing less pain today vs last visit.  Given additional core exercises for HEP, she has a physioball that she uses for some other exercises at home. She is not noticing a consistent improvement at this time.  Pain usually irritated with bending (poor technique), lifting and standing, walking.  She will continue to benefit from skilled PT to provide education on core and posture.    OBJECTIVE IMPAIRMENTS difficulty walking, decreased strength, hypomobility, increased fascial restrictions, postural dysfunction, obesity, and pain, core weakness   ACTIVITY LIMITATIONS carrying, bending, sitting, standing, squatting, bed mobility, and locomotion level   PARTICIPATION LIMITATIONS: cleaning, interpersonal relationship, shopping, community activity, occupation, and fitness    PERSONAL FACTORS Time since onset of injury/illness/exacerbation and 1 comorbidity: obesity with bariatric surgery  are also affecting patient's functional outcome.    REHAB POTENTIAL: Excellent   CLINICAL DECISION MAKING: Stable/uncomplicated   EVALUATION COMPLEXITY: Low     GOALS: Goals reviewed with patient? Yes   LONG TERM GOALS: Target date: 03/18/2022   Pt will be I with HEP for posture,  core strength  Baseline:  Goal status: ongoing    2.  Pt will be able to walk for fitness, up to 3 miles with min limitation due to pain  Baseline:  Status: walked 1 mile this week and had increased pain into hamstrings.  Goal status: ongoing    3.  Patient will report min to no LE pain (centralization) with most ADLs and mobility tasks.  Baseline:  Status: 02/23/22: increased LE pain (  gluteals)  with prolonged standing or sitting Goal status: ongoing    4.  Pt will report 25% improvement in overall pain in low back after work Baseline:  Goal status: ongoing        PLAN: PT FREQUENCY: 1x/week   PT DURATION: 6 weeks   PLANNED INTERVENTIONS: Therapeutic exercises, Therapeutic activity, Neuromuscular re-education, Balance training, Gait training, Patient/Family education, Self Care, Joint mobilization, Dry Needling, Spinal mobilization, Cryotherapy, Moist heat, Taping, Manual therapy, and Re-evaluation.   PLAN FOR NEXT SESSION: check HEP, standing scapular ex. UBE . Review lifting, squatting , limit bending.      Karie Mainland, PT 03/03/22 2:56 PM Phone: [phone] Fax: [phone]

## 2022-03-03 ENCOUNTER — Other Ambulatory Visit: Payer: Self-pay | Admitting: Family Medicine

## 2022-03-03 ENCOUNTER — Encounter: Payer: Self-pay | Admitting: Physical Therapy

## 2022-03-03 ENCOUNTER — Ambulatory Visit: Payer: BLUE CROSS/BLUE SHIELD | Attending: Plastic Surgery | Admitting: Physical Therapy

## 2022-03-03 DIAGNOSIS — M5459 Other low back pain: Secondary | ICD-10-CM | POA: Insufficient documentation

## 2022-03-03 DIAGNOSIS — Z1231 Encounter for screening mammogram for malignant neoplasm of breast: Secondary | ICD-10-CM

## 2022-03-03 DIAGNOSIS — R293 Abnormal posture: Secondary | ICD-10-CM | POA: Diagnosis present

## 2022-03-08 ENCOUNTER — Other Ambulatory Visit: Payer: Self-pay | Admitting: Family Medicine

## 2022-03-08 DIAGNOSIS — Z1231 Encounter for screening mammogram for malignant neoplasm of breast: Secondary | ICD-10-CM

## 2022-03-08 DIAGNOSIS — Z01818 Encounter for other preprocedural examination: Secondary | ICD-10-CM

## 2022-03-10 ENCOUNTER — Encounter: Payer: Self-pay | Admitting: Physical Therapy

## 2022-03-11 ENCOUNTER — Ambulatory Visit: Payer: BLUE CROSS/BLUE SHIELD | Admitting: Physical Therapy

## 2022-03-11 DIAGNOSIS — M5459 Other low back pain: Secondary | ICD-10-CM

## 2022-03-11 DIAGNOSIS — R293 Abnormal posture: Secondary | ICD-10-CM

## 2022-03-11 NOTE — Therapy (Signed)
OUTPATIENT PHYSICAL THERAPY TREATMENT NOTE   Patient Name: Sarah Roach MRN: [id number] DOB:21-Oct-1980, 41 y.o., female Today's Date: 03/11/2022  PCP: NA REFERRING PROVIDER: Janne Napoleon MD  END OF SESSION:   PT End of Session - 03/11/22 0724     Visit Number 6    Number of Visits 7    Date for PT Re-Evaluation 03/25/22    Authorization Type BCBS    PT Start Time 0722    PT Stop Time 0755    PT Time Calculation (min) 33 min               Past Medical History:  Diagnosis Date   Depression    Obesity    No past surgical history on file. There are no problems to display for this patient.   REFERRING DIAG: N62 (ICD-10-CM) - Breast hypertrophy M54.6,G89.29 (ICD-10-CM) - Chronic bilateral thoracic back pain   THERAPY DIAG:  Other low back pain  Abnormal posture  Rationale for Evaluation and Treatment Rehabilitation  PERTINENT HISTORY: see above , none   PRECAUTIONS: none   SUBJECTIVE: Did a lot of walking on Saturday and the pain increased. It has still been painful. Hasn't calmed down yet. Pain is 5/10    PAIN:  Are you having pain? Yes: NPRS scale: 5/10 Pain location: low back  Pain description: pain  Aggravating factors: walking  Relieving factors: not sure    OBJECTIVE: (objective measures completed at initial evaluation unless otherwise dated)  DIAGNOSTIC FINDINGS:  None    PATIENT SURVEYS:  FOTO 42% Status: 44% 03/11/22       COGNITION:           Overall cognitive status: Within functional limits for tasks assessed                          SENSATION: WFL   MUSCLE LENGTH: Hamstrings: WFL Thomas test: Right NT         deg; Left NT deg   POSTURE: rounded shoulders, forward head, increased lumbar lordosis, increased thoracic kyphosis, and genu recurvatum , ant pelvic tilt   PALPATION:  TTP in L4-L5 and along superior glutes , L>R   LUMBAR ROM:    Active  A/PROM  eval AROM 02/23/22  Flexion 25% limited pain on L     Extension WFL feels good   Right lateral flexion WNL pain on L  WNL no pain   Left lateral flexion WNL pain on L  WNL pain on left  Right rotation WNL   Left rotation WNL     (Blank rows = not tested)   LOWER EXTREMITY ROM:      Active  Right eval Left eval  Hip flexion      Hip extension      Hip abduction      Hip adduction      Hip internal rotation      Hip external rotation      Knee flexion      Knee extension      Ankle dorsiflexion      Ankle plantarflexion      Ankle inversion      Ankle eversion       (Blank rows = not tested)   LOWER EXTREMITY MMT:     MMT Right eval Left eval  Hip flexion 5 5  Hip extension 4+ 4+  Hip abduction 4+ 4+  Hip adduction      Hip internal  rotation      Hip external rotation      Knee flexion 5 5  Knee extension 5 5  Ankle dorsiflexion 5 5  Ankle plantarflexion      Ankle inversion      Ankle eversion       (Blank rows = not tested)             Core strength is 4-/5, poor upper and lower ab activation   LUMBAR SPECIAL TESTS:  Straight leg raise test: Negative and Trendelenburg sign: Negative   FUNCTIONAL TESTS:  NT   GAIT: Distance walked: 150 Assistive device utilized: None Level of assistance: Complete Independence Comments: no deviations        TODAY'S TREATMENT   [facility] Adult PT Treatment:                                                DATE: 03/11/22 Therapeutic Exercise: Seated childs pose forward with exercise ball 10 sec 5  SKTC 2 x 30 sec each  Open books Supine horizontal abduction x 20 green Supine Shoulder ER bilat Green x 20 90/90 ab brace- pain Alt knee ext from hooklying with ab brace Bridge with ball  Hs Curl with feet on ball LTR with feet on ball Cat/Camel Childs pose Forward and lateral  FOTO 44%   [facility] Adult PT Treatment:                                                DATE: 03/03/22 Therapeutic Exercise: UBE for 5 min , level 1  Standing Rows Slastix on springboard x 10   Standing horizontal abduction x 10  Standing Bilat Shoulder abd with ER x 10  Abdominal bracing x 10 Pilates circle press x 5 opp arm/opp leg  Ball bridging x 10 x 2 sets , with and without UEs  Lower trunk rotation  x 10 with ball under legs  Open book x 3  Sidelying hip abd x 10 x 2 and clam x 15  Sit to stand x 10 , 10 lbs  Sit to stand x 10 with 10 lbs chest press x 10  High knees 10 lbs at chest x 10 each Single arm 8 lbs overhead press with high knee march  Palloff press x 10 add small rotation x 10 each side   [facility] Adult PT Treatment:                                                DATE: 02/23/22 Therapeutic Exercise: Nustep L5 UE/Le x 6 minutes  Standing Rows Green Standing Ext Green  Standing horizontal abduction Green x 15 Standing Bilat Shoulder ER x 15  90/90 ab brace 10 sec x 8 Open books x 10 each  Bridge 15 Cat/camel Childs pose   [facility] Adult PT Treatment:  DATE: 02/17/22 Therapeutic Exercise: Nustep L 5 UE and LE for 6 min  Cat and camel mod cues , to childs pose  Quadruped hip extension x 5 each side  added opposite arm x 1 for demo  Dynadisc under pelvis for lower abdominals  Post tilt with exhale Shoulder extension green TB x 10 March, cues to avoid post tilt  Shoulder extension hold with alt. March x  5 slow  SLR x 10 each side with disc (core focus)  Articulating bridge x 10 , painful , modified  Hip stretching : knee to chest , figure 4  Row , extension blue band x 15 each  Manual Therapy: 8 min MHP to lumbar [supine)   [facility] Adult PT Treatment:                                                DATE: 02/11/22 Therapeutic Exercise: Nustep L8 UE and LE  x 6 min  Supine HEP review: post pelvic tilt, Transverse Abd x 10  Alternating bent knee fall out x 10 each  Ball press for core activation added oblique press with opp arm opp leg Isometric 90/90 hold , then alternating leg extension for core challenge   Supine ball bridges, ball under lower legs x 10  Hamstring stretch 30 sec x  2 bilateral , ITB x 1  Piriformis seated 2 x 30 sec  Quadruped cat/cow x 5  Child pose lateral and forward    PATIENT EDUCATION:  Education details: posture, HEP, radicular pain  Person educated: Patient Education method: Programmer, multimedia, Demonstration, Verbal cues, and Handouts Education comprehension: verbalized understanding and needs further education     HOME EXERCISE PROGRAM: Access Code: [email or web address] URL: [email or web address] Date: 02/04/2022 Prepared by: Karie Mainland   Exercises - Supine Posterior Pelvic Tilt  - 1 x daily - 7 x weekly - 2 sets - 10 reps - Hooklying Transversus Abdominis Palpation  - 1 x daily - 7 x weekly - 2 sets - 10 reps - 5 hold - Bent Knee Fallouts  - 1 x daily - 7 x weekly - 2 sets - 10 reps - Supine 90/90 Abdominal Bracing  - 1 x daily - 7 x weekly - 5 reps - 30 hold - Seated Piriformis Stretch with Trunk Bend  - 1 x daily - 7 x weekly - 5 reps - 30 hold Row, extension blue band  Ball exercises Bridge and curl   ASSESSMENT:   CLINICAL IMPRESSION: Patient experiencing more pain today vs last visit after participating in weekend activities that included prolonged standing and walking. She has tried her HEP , heat pads and OTC meds without much relief. Focused general mobility and stretching today. She did well with the new stability ball exercises during review. She declined modalities as end of session.    She will continue to benefit from skilled PT to provide education on core and posture.    OBJECTIVE IMPAIRMENTS difficulty walking, decreased strength, hypomobility, increased fascial restrictions, postural dysfunction, obesity, and pain, core weakness   ACTIVITY LIMITATIONS carrying, bending, sitting, standing, squatting, bed mobility, and locomotion level   PARTICIPATION LIMITATIONS: cleaning, interpersonal relationship, shopping, community activity,  occupation, and fitness    PERSONAL FACTORS Time since onset of injury/illness/exacerbation and 1 comorbidity: obesity with bariatric surgery  are also affecting patient's functional outcome.    REHAB POTENTIAL:  Excellent   CLINICAL DECISION MAKING: Stable/uncomplicated   EVALUATION COMPLEXITY: Low     GOALS: Goals reviewed with patient? Yes   LONG TERM GOALS: Target date: 03/18/2022   Pt will be I with HEP for posture, core strength  Baseline:  Goal status: ongoing    2.  Pt will be able to walk for fitness, up to 3 miles with min limitation due to pain  Baseline:  Status: walked 1 mile this week and had increased pain into hamstrings.  Goal status: ongoing    3.  Patient will report min to no LE pain (centralization) with most ADLs and mobility tasks.  Baseline:  Status: 02/23/22: increased LE pain (gluteals)  with prolonged standing or sitting Goal status: ongoing    4.  Pt will report 25% improvement in overall pain in low back after work Baseline:  Goal status: ongoing        PLAN: PT FREQUENCY: 1x/week   PT DURATION: 6 weeks   PLANNED INTERVENTIONS: Therapeutic exercises, Therapeutic activity, Neuromuscular re-education, Balance training, Gait training, Patient/Family education, Self Care, Joint mobilization, Dry Needling, Spinal mobilization, Cryotherapy, Moist heat, Taping, Manual therapy, and Re-evaluation.   PLAN FOR NEXT SESSION: plan to discharge next visit.      Jannette Spanner, PTA 03/11/22 8:23 AM Phone: [phone] Fax: [phone]

## 2022-03-12 ENCOUNTER — Ambulatory Visit
Admission: RE | Admit: 2022-03-12 | Discharge: 2022-03-12 | Disposition: A | Discharge status: Home / Self Care | Payer: BLUE CROSS/BLUE SHIELD | Source: Ambulatory Visit | Attending: Family Medicine | Admitting: Family Medicine

## 2022-03-12 DIAGNOSIS — Z1231 Encounter for screening mammogram for malignant neoplasm of breast: Secondary | ICD-10-CM

## 2022-03-12 DIAGNOSIS — Z01818 Encounter for other preprocedural examination: Secondary | ICD-10-CM

## 2022-03-16 ENCOUNTER — Encounter: Payer: Self-pay | Admitting: Physical Therapy

## 2022-03-16 ENCOUNTER — Ambulatory Visit: Payer: BLUE CROSS/BLUE SHIELD | Admitting: Physical Therapy

## 2022-03-16 DIAGNOSIS — M5459 Other low back pain: Secondary | ICD-10-CM

## 2022-03-16 DIAGNOSIS — R293 Abnormal posture: Secondary | ICD-10-CM

## 2022-03-16 NOTE — Therapy (Addendum)
OUTPATIENT PHYSICAL THERAPY TREATMENT NOTE   Patient Name: Sarah Roach MRN: [id number] DOB:08-13-1980, 41 y.o., female Today's Date: 03/16/2022  PCP: NA REFERRING PROVIDER: Lennice Sites MD  END OF SESSION:   PT End of Session - 03/16/22 0721     Visit Number 7    Number of Visits 7    Date for PT Re-Evaluation 03/25/22    Authorization Type [employer]    PT Start Time 0720    PT Stop Time 0746    PT Time Calculation (min) 26 min               Past Medical History:  Diagnosis Date   Depression    Obesity    History reviewed. No pertinent surgical history. There are no problems to display for this patient.   REFERRING DIAG: N62 (ICD-10-CM) - Breast hypertrophy M54.6,G89.29 (ICD-10-CM) - Chronic bilateral thoracic back pain   THERAPY DIAG:  Other low back pain  Abnormal posture  Rationale for Evaluation and Treatment Rehabilitation  PERTINENT HISTORY: see above , none   PRECAUTIONS: none   SUBJECTIVE: The back is better. Pain is a 4/10. But it still shoots down my leg during the day.     PAIN:  Are you having pain? Yes: NPRS scale: 4/10 Pain location: low back  Pain description: pain  Aggravating factors: walking  Relieving factors: not sure    OBJECTIVE: (objective measures completed at initial evaluation unless otherwise dated)  DIAGNOSTIC FINDINGS:  None    PATIENT SURVEYS:  FOTO 42% Status: 44% 03/11/22       COGNITION:           Overall cognitive status: Within functional limits for tasks assessed                          SENSATION: WFL   MUSCLE LENGTH: Hamstrings: WFL Thomas test: Right NT         deg; Left NT deg   POSTURE: rounded shoulders, forward head, increased lumbar lordosis, increased thoracic kyphosis, and genu recurvatum , ant pelvic tilt   PALPATION:  TTP in L4-L5 and along superior glutes , L>R   LUMBAR ROM:    Active  A/PROM  eval AROM 02/23/22  Flexion 25% limited pain on L    Extension WFL feels  good   Right lateral flexion WNL pain on L  WNL no pain   Left lateral flexion WNL pain on L  WNL pain on left  Right rotation WNL   Left rotation WNL     (Blank rows = not tested)   LOWER EXTREMITY ROM:      Active  Right eval Left eval  Hip flexion      Hip extension      Hip abduction      Hip adduction      Hip internal rotation      Hip external rotation      Knee flexion      Knee extension      Ankle dorsiflexion      Ankle plantarflexion      Ankle inversion      Ankle eversion       (Blank rows = not tested)   LOWER EXTREMITY MMT:     MMT Right eval Left eval  Hip flexion 5 5  Hip extension 4+ 4+  Hip abduction 4+ 4+  Hip adduction      Hip internal rotation  Hip external rotation      Knee flexion 5 5  Knee extension 5 5  Ankle dorsiflexion 5 5  Ankle plantarflexion      Ankle inversion      Ankle eversion       (Blank rows = not tested)             Core strength is 4-/5, poor upper and lower ab activation   LUMBAR SPECIAL TESTS:  Straight leg raise test: Negative and Trendelenburg sign: Negative   FUNCTIONAL TESTS:  NT   GAIT: Distance walked: 150 Assistive device utilized: None Level of assistance: Complete Independence Comments: no deviations        TODAY'S TREATMENT  [facility] Adult PT Treatment:                                                DATE: 03/16/22 Therapeutic Exercise: UBE retro 3 minutes L2 Standing rows blue  Standing shoulder ext green Cat/camel x 10 Childs pose  H/s curl with feet on ball - ab brace Bridge with feet on ball LTR feet on ball 90/90 ab brace 5 sec  Piriformis stretch    [facility] Adult PT Treatment:                                                DATE: 03/11/22 Therapeutic Exercise: Seated childs pose forward with exercise ball 10 sec 5  SKTC 2 x 30 sec each  Open books Supine horizontal abduction x 20 green Supine Shoulder ER bilat Green x 20 90/90 ab brace- pain Alt knee ext from hooklying with ab  brace Bridge with ball  Hs Curl with feet on ball LTR with feet on ball Cat/Camel Childs pose Forward and lateral  FOTO 44%   [facility] Adult PT Treatment:                                                DATE: 03/03/22 Therapeutic Exercise: UBE for 5 min , level 1  Standing Rows Slastix on springboard x 10  Standing horizontal abduction x 10  Standing Bilat Shoulder abd with ER x 10  Abdominal bracing x 10 Pilates circle press x 5 opp arm/opp leg  Ball bridging x 10 x 2 sets , with and without UEs  Lower trunk rotation  x 10 with ball under legs  Open book x 3  Sidelying hip abd x 10 x 2 and clam x 15  Sit to stand x 10 , 10 lbs  Sit to stand x 10 with 10 lbs chest press x 10  High knees 10 lbs at chest x 10 each Single arm 8 lbs overhead press with high knee march  Palloff press x 10 add small rotation x 10 each side   [facility] Adult PT Treatment:                                                DATE: 02/23/22 Therapeutic  Exercise: Nustep L5 UE/Le x 6 minutes  Standing Rows Green Standing Ext Green  Standing horizontal abduction Green x 15 Standing Bilat Shoulder ER x 15  90/90 ab brace 10 sec x 8 Open books x 10 each  Bridge 15 Cat/camel Childs pose   [facility] Adult PT Treatment:                                                DATE: 02/17/22 Therapeutic Exercise: Nustep L 5 UE and LE for 6 min  Cat and camel mod cues , to childs pose  Quadruped hip extension x 5 each side  added opposite arm x 1 for demo  Dynadisc under pelvis for lower abdominals  Post tilt with exhale Shoulder extension green TB x 10 March, cues to avoid post tilt  Shoulder extension hold with alt. March x  5 slow  SLR x 10 each side with disc (core focus)  Articulating bridge x 10 , painful , modified  Hip stretching : knee to chest , figure 4  Row , extension blue band x 15 each  Manual Therapy: 8 min MHP to lumbar [supine)   [facility] Adult PT Treatment:                                                DATE:  02/11/22 Therapeutic Exercise: Nustep L8 UE and LE  x 6 min  Supine HEP review: post pelvic tilt, Transverse Abd x 10  Alternating bent knee fall out x 10 each  Ball press for core activation added oblique press with opp arm opp leg Isometric 90/90 hold , then alternating leg extension for core challenge  Supine ball bridges, ball under lower legs x 10  Hamstring stretch 30 sec x  2 bilateral , ITB x 1  Piriformis seated 2 x 30 sec  Quadruped cat/cow x 5  Child pose lateral and forward    PATIENT EDUCATION:  Education details: posture, HEP, radicular pain  Person educated: Patient Education method: Consulting civil engineer, Demonstration, Verbal cues, and Handouts Education comprehension: verbalized understanding and needs further education     HOME EXERCISE PROGRAM: Access Code: [email or web address] URL: [email or web address] Date: 02/04/2022 Prepared by: Raeford Razor   Exercises - Supine Posterior Pelvic Tilt  - 1 x daily - 7 x weekly - 2 sets - 10 reps - Hooklying Transversus Abdominis Palpation  - 1 x daily - 7 x weekly - 2 sets - 10 reps - 5 hold - Bent Knee Fallouts  - 1 x daily - 7 x weekly - 2 sets - 10 reps - Supine 90/90 Abdominal Bracing  - 1 x daily - 7 x weekly - 5 reps - 30 hold - Seated Piriformis Stretch with Trunk Bend  - 1 x daily - 7 x weekly - 5 reps - 30 hold Row, extension blue band  Ball exercises Bridge and curl   ASSESSMENT:   CLINICAL IMPRESSION: Patient experiencing less pain today vs last visit after participating in weekend activities that included prolonged standing and walking. She reports continued radicular pain although less intensity of pain, end of work day pain is unchanged. She had slight improvement in her FOTO score. She is able to complete prolonged walking with min  difficulty however significant delayed increased in pain. Overall she has made minimal progress with PT and is appropriate for discharge. She will follow up with MD regarding next steps.      OBJECTIVE IMPAIRMENTS difficulty walking, decreased strength, hypomobility, increased fascial restrictions, postural dysfunction, obesity, and pain, core weakness   ACTIVITY LIMITATIONS carrying, bending, sitting, standing, squatting, bed mobility, and locomotion level   PARTICIPATION LIMITATIONS: cleaning, interpersonal relationship, shopping, community activity, occupation, and fitness    PERSONAL FACTORS Time since onset of injury/illness/exacerbation and 1 comorbidity: obesity with bariatric surgery  are also affecting patient's functional outcome.    REHAB POTENTIAL: Excellent   CLINICAL DECISION MAKING: Stable/uncomplicated   EVALUATION COMPLEXITY: Low     GOALS: Goals reviewed with patient? Yes   LONG TERM GOALS: Target date: 03/18/2022   Pt will be I with HEP for posture, core strength  Baseline:  Goal status: MET   2.  Pt will be able to walk for fitness, up to 3 miles with min limitation due to pain  Baseline:  Status: walked 1 mile this week and had increased pain into hamstrings.  Status: 03/16/22 Can do  lot of walking but afterward had significant increased pain hours later or next day Goal status: PARTIALLY MET   3.  Patient will report min to no LE pain (centralization) with most ADLs and mobility tasks.  Baseline:  Status: 02/23/22: increased LE pain (gluteals)  with prolonged standing or sitting Status: 03/16/22: pain intensity is decreased, frequency the same Goal status: NOT MET   4.  Pt will report 25% improvement in overall pain in low back after work Baseline:  Status: 03/16/22: no change  Goal status: NOT MET       PLAN: PT FREQUENCY: 1x/week   PT DURATION: 6 weeks   PLANNED INTERVENTIONS: Therapeutic exercises, Therapeutic activity, Neuromuscular re-education, Balance training, Gait training, Patient/Family education, Self Care, Joint mobilization, Dry Needling, Spinal mobilization, Cryotherapy, Moist heat, Taping, Manual therapy, and  Re-evaluation.   PLAN FOR NEXT SESSION: N/A discharge next visit     Hessie Diener, PTA 03/16/22 7:49 AM Phone: [phone] Fax: [phone]      PHYSICAL THERAPY DISCHARGE SUMMARY  Visits from Start of Care: 7  Current functional level related to goals / functional outcomes: See above    Remaining deficits: Pain, radicular sx, weakness    Education / Equipment: HEP, core, walking, lifting , posture    Patient agrees to discharge. Patient goals were partially met. Patient is being discharged due to  completed visits necessary to get her surgery. Raeford Razor, PT 03/16/22 8:34 AM Phone: [phone] Fax: [phone]

## 2022-03-17 ENCOUNTER — Telehealth: Payer: Self-pay | Admitting: Plastic Surgery

## 2022-03-17 NOTE — Telephone Encounter (Signed)
Wanted to inform us the PT has been completed and he mammogram was completed on 03/08/22. Please contact to update next steps.

## 2022-03-19 ENCOUNTER — Telehealth: Payer: Self-pay | Admitting: *Deleted

## 2022-03-19 NOTE — Telephone Encounter (Signed)
Auth submitted for CPT [id number] & [id number] via [email or web address]  [id number] pending

## 2022-10-11 ENCOUNTER — Ambulatory Visit
Admission: EM | Admit: 2022-10-11 | Discharge: 2022-10-11 | Disposition: A | Discharge status: Home / Self Care | Payer: BC Managed Care – PPO

## 2022-10-11 DIAGNOSIS — B029 Zoster without complications: Secondary | ICD-10-CM

## 2022-10-11 MED ORDER — VALACYCLOVIR HCL 1 G PO TABS: 1000.0000 mg | ORAL_TABLET | Freq: Three times a day (TID) | ORAL | 0 refills | Status: AC

## 2022-10-11 NOTE — ED Triage Notes (Signed)
Pt states "something bit me' in the right leg yesterday. Reports are is itching and red.

## 2022-10-11 NOTE — ED Provider Notes (Signed)
[facility]    CSN: [id number] Arrival date & time: 10/11/22  1128      History   Chief Complaint Chief Complaint  Patient presents with   Insect Bite    HPI Sarah Roach is a 42 y.o. female.   Patient presents with concerns for insect bite to right thigh that she noticed yesterday.  Reports that she did not notice or see anything bite her but reports that she started noticing some itchiness.  Denies any pain to the area.  Denies any drainage from the area.  Denies any fever.  Patient has applied topical antibiotic ointment with minimal improvement.     Past Medical History:  Diagnosis Date   Depression    Obesity     There are no problems to display for this patient.   History reviewed. No pertinent surgical history.  OB History     Gravida  3   Para  3   Term  3   Preterm      AB      Living  3      SAB      IAB      Ectopic      Multiple      Live Births               Home Medications    Prior to Admission medications   Medication Sig Start Date End Date Taking? Authorizing Provider  Multiple Vitamin (MULTIVITAMIN) capsule Take 1 capsule by mouth daily.   Yes [provider]  valACYclovir (VALTREX) 1000 MG tablet Take 1 tablet (1,000 mg total) by mouth 3 (three) times daily for 7 days. 10/11/22 10/18/22 Yes Bain Whichard, Acie Fredrickson, FNP  Melatonin 10 MG TABS Take 20 mg by mouth at bedtime.    [provider]    Family History Family History  Problem Relation Age of Onset   Breast cancer Mother    Depression Mother    Healthy Father    Breast cancer Maternal Grandmother     Social History Social History   Tobacco Use   Smoking status: Never   Smokeless tobacco: Never  Vaping Use   Vaping Use: Never used  Substance Use Topics   Alcohol use: No   Drug use: No     Allergies   Patient has no known allergies.   Review of Systems Review of Systems Per HPI  Physical Exam Triage Vital  Signs ED Triage Vitals  Enc Vitals Group     BP 10/11/22 1226 (!) 141/78     Pulse Rate 10/11/22 1226 63     Resp 10/11/22 1226 16     Temp 10/11/22 1226 98.1 F (36.7 C)     Temp Source 10/11/22 1226 Oral     SpO2 10/11/22 1226 99 %     Weight --      Height --      Head Circumference --      Peak Flow --      Pain Score 10/11/22 1229 0     Pain Loc --      Pain Edu? --      Excl. in GC? --    No data found.  Updated Vital Signs BP (!) 141/78 (BP Location: Left Arm)   Pulse 63   Temp 98.1 F (36.7 C) (Oral)   Resp 16   LMP 10/09/2022 (Exact Date)   SpO2 99%   Visual Acuity Right Eye Distance:  Left Eye Distance:   Bilateral Distance:    Right Eye Near:   Left Eye Near:    Bilateral Near:     Physical Exam Constitutional:      General: She is not in acute distress.    Appearance: Normal appearance. She is not toxic-appearing or diaphoretic.  HENT:     Head: Normocephalic and atraumatic.  Eyes:     Extraocular Movements: Extraocular movements intact.     Conjunctiva/sclera: Conjunctivae normal.  Pulmonary:     Effort: Pulmonary effort is normal.  Skin:    Comments: Patient has a small cluster of vesicular lesions with surrounding erythema present to right anterior medial thigh.  No drainage noted.  Neurological:     General: No focal deficit present.     Mental Status: She is alert and oriented to person, place, and time. Mental status is at baseline.  Psychiatric:        Mood and Affect: Mood normal.        Behavior: Behavior normal.        Thought Content: Thought content normal.        Judgment: Judgment normal.      UC Treatments / Results  Labs (all labs ordered are listed, but only abnormal results are displayed) Labs Reviewed - No data to display  EKG   Radiology No results found.  Procedures Procedures (including critical care time)  Medications Ordered in UC Medications - No data to display  Initial Impression / Assessment and  Plan / UC Course  I have reviewed the triage vital signs and the nursing notes.  Pertinent labs & imaging results that were available during my care of the patient were reviewed by me and considered in my medical decision making (see chart for details).     Rash is consistent with herpes zoster.  Will treat with Valtrex.  Patient advised to monitor closely and to follow-up if any symptoms persist or worsen.  Patient verbalized understanding and was agreeable with plan. Final Clinical Impressions(s) / UC Diagnoses   Final diagnoses:  Herpes zoster without complication     Discharge Instructions      It appears that you have shingles.  I have prescribed an antiviral medication to treat this.  Follow-up if any symptoms persist or worsen.    ED Prescriptions     Medication Sig Dispense Auth. Provider   valACYclovir (VALTREX) 1000 MG tablet Take 1 tablet (1,000 mg total) by mouth 3 (three) times daily for 7 days. 21 tablet [place], Acie Fredrickson, [place]      PDMP not reviewed this encounter.   Gustavus Bryant, [place] 10/11/22 1256

## 2022-10-11 NOTE — Discharge Instructions (Signed)
It appears that you have shingles.  I have prescribed an antiviral medication to treat this.  Follow-up if any symptoms persist or worsen.
